# Patient Record
Sex: Female | Born: 1985
Health system: Southern US, Community
[De-identification: ages and names within clinical notes are randomized; demographics above are authoritative.]

## PROBLEM LIST (undated history)

## (undated) DIAGNOSIS — O321XX0 Maternal care for breech presentation, not applicable or unspecified: Secondary | ICD-10-CM

## (undated) DIAGNOSIS — T7840XA Allergy, unspecified, initial encounter: Secondary | ICD-10-CM

## (undated) DIAGNOSIS — N1330 Unspecified hydronephrosis: Secondary | ICD-10-CM

## (undated) HISTORY — DX: Unspecified hydronephrosis: N13.30

## (undated) HISTORY — DX: Allergy, unspecified, initial encounter: T78.40XA

## (undated) HISTORY — PX: ABDOMINAL SURGERY: SHX537

## (undated) HISTORY — DX: Maternal care for breech presentation, not applicable or unspecified: O32.1XX0

---

## 2015-02-18 HISTORY — PX: ANTERIOR CRUCIATE LIGAMENT REPAIR: SHX115

## 2016-09-13 LAB — OB RESULTS CONSOLE HIV ANTIBODY (ROUTINE TESTING): HIV: NONREACTIVE

## 2016-09-13 LAB — OB RESULTS CONSOLE ABO/RH: RH Type: POSITIVE

## 2016-09-13 LAB — OB RESULTS CONSOLE ANTIBODY SCREEN: ANTIBODY SCREEN: NEGATIVE

## 2016-09-13 LAB — OB RESULTS CONSOLE HEPATITIS B SURFACE ANTIGEN: HEP B S AG: NEGATIVE

## 2016-09-13 LAB — OB RESULTS CONSOLE GC/CHLAMYDIA
CHLAMYDIA, DNA PROBE: NEGATIVE
Gonorrhea: NEGATIVE

## 2016-09-13 LAB — OB RESULTS CONSOLE RUBELLA ANTIBODY, IGM: Rubella: IMMUNE

## 2016-09-13 LAB — OB RESULTS CONSOLE RPR: RPR: NONREACTIVE

## 2016-10-25 ENCOUNTER — Telehealth (HOSPITAL_COMMUNITY): Payer: Self-pay | Admitting: *Deleted

## 2016-10-25 ENCOUNTER — Encounter (HOSPITAL_COMMUNITY): Payer: Self-pay | Admitting: *Deleted

## 2016-10-25 NOTE — Telephone Encounter (Signed)
Preadmission screen  

## 2016-10-25 NOTE — Telephone Encounter (Signed)
Anesthesia notified of pt interest in epidural for version.

## 2016-10-31 ENCOUNTER — Observation Stay (HOSPITAL_COMMUNITY)
Admission: RE | Admit: 2016-10-31 | Discharge: 2016-10-31 | Disposition: A | Discharge status: Home / Self Care | Payer: BLUE CROSS/BLUE SHIELD | Source: Ambulatory Visit | Attending: Obstetrics and Gynecology | Admitting: Obstetrics and Gynecology

## 2016-10-31 ENCOUNTER — Encounter (HOSPITAL_COMMUNITY): Payer: Self-pay

## 2016-10-31 DIAGNOSIS — Z3A37 37 weeks gestation of pregnancy: Secondary | ICD-10-CM | POA: Insufficient documentation

## 2016-10-31 DIAGNOSIS — O321XX0 Maternal care for breech presentation, not applicable or unspecified: Secondary | ICD-10-CM | POA: Diagnosis not present

## 2016-10-31 MED ORDER — LACTATED RINGERS IV SOLN
INTRAVENOUS | Status: DC
  Administered 2016-10-31: 08:00:00 via INTRAVENOUS

## 2016-10-31 MED ORDER — TERBUTALINE SULFATE 1 MG/ML IJ SOLN
0.2500 mg | Freq: Once | INTRAMUSCULAR | Status: AC
  Administered 2016-10-31: 0.25 mg via SUBCUTANEOUS
  Filled 2016-10-31: qty 1

## 2016-10-31 NOTE — Procedures (Signed)
External Cephalic Version  Pt was confirmed breech by ultrasound today.  She desires attempt at ECV.  Risks and benefits have been discussed at length and informed consent was obtained. EFM with reactive tracing and patient was given terbutaline .25mg  SQ.  IV had been placed and Ultrasound at bedside.  Fetal vertex in LUQ was confirmed and fetal buttocks was elevated out of the pelvis.  In a clockwise fashion, ECV was attempt 3 times without successful placement of fetal vertex in the cephalic position.  Continued frank breech presentation was confirmed by ultrasound.  Patient agreed that no further attempts should be tried.  Pt and fetus tolerated the procedure well.  EFM was reassuring immediately after the procedure and the patient was excited and reassured by the procedure. Plan to monitor EFM for 1 hour and if remains reassuring will discharge the patient home with routine labor warnings and kickcounts.  Follow up in the office as scheduled next week.

## 2016-10-31 NOTE — H&P (Signed)
Debra Debra [name] is a 31 y.o. female presenting for ECV attempt. Pregnancy uncomplicated.  US yesterday Debra [name] breech with AFI 10 and EFW 50%.  GBS-. OB History    Gravida Para Term Preterm AB Living   2       1     SAB TAB Ectopic Multiple Live Births   1             Past Medical History:  Diagnosis Date  . Hydronephrosis    Past Surgical History:  Procedure Laterality Date  . ABDOMINAL SURGERY     age 89  . ANTERIOR CRUCIATE LIGAMENT REPAIR     Family History: family history includes Alcohol abuse in her father and paternal grandfather; Breast cancer in her maternal grandmother and mother; Rheum arthritis in her paternal aunt. Social History:  reports that she has never smoked. She has never used smokeless tobacco. She reports that she does not drink alcohol or use drugs.     Maternal Diabetes: No Genetic Screening: Normal Maternal Ultrasounds/Referrals: Normal Fetal Ultrasounds or other Referrals:  None Maternal Substance Abuse:  No Significant Maternal Medications:  None Significant Maternal Lab Results:  None Other Comments:  None  ROS History   There were no vitals taken for this visit. Exam Physical Exam  Breech Cx 2/50/high Prenatal labs: ABO, Rh: O/Positive/-- (06/27 0000) Antibody: Negative (06/27 0000) Rubella: Immune (06/27 0000) RPR: Nonreactive (06/27 0000)  HBsAg: Negative (06/27 0000)  HIV: Non-reactive (06/27 0000)  GBS:     Assessment/Plan: IUP at term Debra Debra [name] Breech presentation Pt desires ECV attempt.  R&B discussed at length and informed consent obtained.    Debra Debra [name] C 10/31/2016, 8:19 AM

## 2016-10-31 NOTE — Discharge Summary (Signed)
Physician Discharge Summary  Patient ID: Debra [name] MRN: [id number] DOB/AGE: 33-Dec-1987 33 y.o.  Admit date: 10/31/2016 Discharge date: 10/31/2016  Admission Diagnoses:breech presentation for attempt at ECV  Discharge Diagnoses: breech presentation Active Problems:   * No active hospital problems. *   Discharged Condition: good  Hospital Course: [facility] After bedside US and EFM was discharged home  Consults: None  Significant Diagnostic Studies: bedside ultrasound  Treatments: external cephalic version attempt  Discharge Exam: There were no vitals taken for this visit. General appearance: alert, cooperative, appears stated age and no distress gravid,nt  breech  Disposition: Final discharge disposition not confirmed  Discharge Instructions    Diet general    Complete by:  As directed      Allergies as of 10/31/2016   No Known Allergies     Medication List    You have not been prescribed any medications.      Signed: Adaia Matthies C 10/31/2016, 8:50 AM

## 2016-11-03 ENCOUNTER — Encounter (HOSPITAL_COMMUNITY): Payer: Self-pay

## 2016-11-09 NOTE — H&P (Addendum)
Debra [name] is a 31 y.o. female @ 39w presenting for primary c-section - breech presentation.  Pt had attempted ECV - fetus still breech.  Pregnancy uncomplicated.  OB History    Gravida Para Term Preterm AB Living   2       1     SAB TAB Ectopic Multiple Live Births   1             Past Medical History:  Diagnosis Date  . Hydronephrosis    Past Surgical History:  Procedure Laterality Date  . ABDOMINAL SURGERY     age 72  . ANTERIOR CRUCIATE LIGAMENT REPAIR     Family History: family history includes Alcohol abuse in her father and paternal grandfather; Breast cancer in her maternal grandmother and mother; Rheum arthritis in her paternal aunt. Social History:  reports that she has never smoked. She has never used smokeless tobacco. She reports that she does not drink alcohol or use drugs.     Maternal Diabetes: No Genetic Screening: Declined Maternal Ultrasounds/Referrals: Normal Fetal Ultrasounds or other Referrals:  None Maternal Substance Abuse:  No Significant Maternal Medications:  None Significant Maternal Lab Results:  None Other Comments:  None  ROS History   Exam Physical Exam  Gen - NAD ABd - gravid, NT  EFW 7.5# Ext - NT Cvx 1-2cm Prenatal labs: ABO, Rh: O/Positive/-- (06/27 0000) Antibody: Negative (06/27 0000) Rubella: Immune (06/27 0000) RPR: Nonreactive (06/27 0000)  HBsAg: Negative (06/27 0000)  HIV: Non-reactive (06/27 0000)  GBS:   neg  Assessment/Plan: Breech - for primary c-section R/b/a discussed, questions answered, informed consent  Marios Gaiser 11/09/2016, 2:44 PM

## 2016-11-10 ENCOUNTER — Telehealth (HOSPITAL_COMMUNITY): Payer: Self-pay | Admitting: *Deleted

## 2016-11-10 ENCOUNTER — Encounter (HOSPITAL_COMMUNITY)
Admission: RE | Admit: 2016-11-10 | Discharge: 2016-11-10 | Disposition: A | Discharge status: Home / Self Care | Payer: BLUE CROSS/BLUE SHIELD | Source: Ambulatory Visit | Attending: Obstetrics and Gynecology | Admitting: Obstetrics and Gynecology

## 2016-11-10 DIAGNOSIS — N1330 Unspecified hydronephrosis: Secondary | ICD-10-CM | POA: Insufficient documentation

## 2016-11-10 DIAGNOSIS — Z01812 Encounter for preprocedural laboratory examination: Secondary | ICD-10-CM

## 2016-11-10 LAB — TYPE AND SCREEN
ABO/RH(D): O POS
ANTIBODY SCREEN: NEGATIVE

## 2016-11-10 LAB — CBC
HCT: 35.6 % — ABNORMAL LOW (ref 36.0–46.0)
Hemoglobin: 11.6 g/dL — ABNORMAL LOW (ref 12.0–15.0)
MCH: 26.8 pg (ref 26.0–34.0)
MCHC: 32.6 g/dL (ref 30.0–36.0)
MCV: 82.2 fL (ref 78.0–100.0)
PLATELETS: 232 10*3/uL (ref 150–400)
RBC: 4.33 MIL/uL (ref 3.87–5.11)
RDW: 14 % (ref 11.5–15.5)
WBC: 9.2 10*3/uL (ref 4.0–10.5)

## 2016-11-10 LAB — ABO/RH: ABO/RH(D): O POS

## 2016-11-10 NOTE — Patient Instructions (Signed)
20 Afrika KENNADI RECLA  11/10/2016   Your procedure is scheduled on:  11/14/2016  Enter through the Main Entrance of [facility] at 1100 AM.  Pick up the phone at the desk and dial [phone].   Call this number if you have problems the morning of surgery: [phone]   Remember:   Do not eat food:After Midnight.  Do not drink clear liquids: After Midnight.  Take these medicines the morning of surgery with A SIP OF WATER: NONE   Do not wear jewelry, make-up or nail polish.  Do not wear lotions, powders, or perfumes. Do not wear deodorant.  Do not shave 48 hours prior to surgery.  Do not bring valuables to the hospital.  [facility] is not   responsible for any belongings or valuables brought to the hospital.  Contacts, dentures or bridgework may not be worn into surgery.  Leave suitcase in the car. After surgery it may be brought to your room.  For patients admitted to the hospital, checkout time is 11:00 AM the day of              discharge.   Patients discharged the day of surgery will not be allowed to drive             home.  Name and phone number of your driver: NA  Special Instructions:   N/A   Please read over the following fact sheets that you were given:   Surgical Site Infection Prevention

## 2016-11-11 LAB — RPR: RPR: NONREACTIVE

## 2016-11-12 NOTE — Anesthesia Preprocedure Evaluation (Addendum)
Anesthesia Evaluation  Patient identified by MRN, date of birth, ID band Patient awake    Reviewed: Allergy & Precautions, H&P , NPO status , Patient's Chart, lab work & pertinent test results  Airway Mallampati: I  TM Distance: >3 FB Neck ROM: full    Dental no notable dental hx.    Pulmonary neg pulmonary ROS,    Pulmonary exam normal breath sounds clear to auscultation       Cardiovascular negative cardio ROS Normal cardiovascular exam Rhythm:regular Rate:Normal     Neuro/Psych negative neurological ROS  negative psych ROS   GI/Hepatic negative GI ROS, Neg liver ROS,   Endo/Other  negative endocrine ROS  Renal/GU negative Renal ROS  negative genitourinary   Musculoskeletal   Abdominal   Peds  Hematology negative hematology ROS (+)   Anesthesia Other Findings   Reproductive/Obstetrics (+) Pregnancy                            Anesthesia Physical Anesthesia Plan  ASA: II  Anesthesia Plan: Spinal   Post-op Pain Management:    Induction:   PONV Risk Score and Plan: 2 and Ondansetron, Dexamethasone and Scopolamine patch - Pre-op  Airway Management Planned: Nasal Cannula  Additional Equipment:   Intra-op Plan:   Post-operative Plan:   Informed Consent: I have reviewed the patients History and Physical, chart, labs and discussed the procedure including the risks, benefits and alternatives for the proposed anesthesia with the patient or authorized representative who has indicated his/her understanding and acceptance.     Plan Discussed with:   Anesthesia Plan Comments:         Anesthesia Quick Evaluation

## 2016-11-13 ENCOUNTER — Encounter (HOSPITAL_COMMUNITY): Payer: Self-pay | Admitting: General Practice

## 2016-11-13 ENCOUNTER — Inpatient Hospital Stay (HOSPITAL_COMMUNITY)
Admission: AD | Admit: 2016-11-13 | Discharge: 2016-11-15 | DRG: 766 | Disposition: A | Discharge status: Home / Self Care | Payer: BLUE CROSS/BLUE SHIELD | Source: Ambulatory Visit | Attending: Obstetrics and Gynecology | Admitting: Obstetrics and Gynecology

## 2016-11-13 ENCOUNTER — Inpatient Hospital Stay (HOSPITAL_COMMUNITY): Payer: BLUE CROSS/BLUE SHIELD | Admitting: Anesthesiology

## 2016-11-13 ENCOUNTER — Encounter (HOSPITAL_COMMUNITY)
Admission: AD | Disposition: A | Discharge status: Home / Self Care | Payer: Self-pay | Source: Ambulatory Visit | Attending: Obstetrics and Gynecology

## 2016-11-13 DIAGNOSIS — Z3A39 39 weeks gestation of pregnancy: Secondary | ICD-10-CM

## 2016-11-13 DIAGNOSIS — O321XX0 Maternal care for breech presentation, not applicable or unspecified: Principal | ICD-10-CM | POA: Diagnosis present

## 2016-11-13 HISTORY — DX: Maternal care for breech presentation, not applicable or unspecified: O32.1XX0

## 2016-11-13 SURGERY — Surgical Case
Anesthesia: Spinal

## 2016-11-13 MED ORDER — ACETAMINOPHEN 325 MG PO TABS
650.0000 mg | ORAL_TABLET | ORAL | Status: DC | PRN
  Administered 2016-11-13: 650 mg via ORAL
  Filled 2016-11-13: qty 2

## 2016-11-13 MED ORDER — MENTHOL 3 MG MT LOZG: 1.0000 | LOZENGE | OROMUCOSAL | Status: DC | PRN

## 2016-11-13 MED ORDER — DIPHENHYDRAMINE HCL 25 MG PO CAPS: 25.0000 mg | ORAL_CAPSULE | Freq: Four times a day (QID) | ORAL | Status: DC | PRN

## 2016-11-13 MED ORDER — ONDANSETRON HCL 4 MG PO TABS
8.0000 mg | ORAL_TABLET | Freq: Three times a day (TID) | ORAL | Status: DC | PRN
  Filled 2016-11-13: qty 2

## 2016-11-13 MED ORDER — OXYTOCIN 10 UNIT/ML IJ SOLN
INTRAVENOUS | Status: DC | PRN
  Administered 2016-11-13: 40 [IU] via INTRAVENOUS

## 2016-11-13 MED ORDER — SODIUM CHLORIDE 0.9 % IR SOLN
Status: DC | PRN
  Administered 2016-11-13: 1000 mL

## 2016-11-13 MED ORDER — MEDROXYPROGESTERONE ACETATE 150 MG/ML IM SUSP: 150.0000 mg | INTRAMUSCULAR | Status: DC | PRN

## 2016-11-13 MED ORDER — FENTANYL CITRATE (PF) 100 MCG/2ML IJ SOLN
INTRAMUSCULAR | Status: AC
  Filled 2016-11-13: qty 2

## 2016-11-13 MED ORDER — SIMETHICONE 80 MG PO CHEW: 80.0000 mg | CHEWABLE_TABLET | ORAL | Status: DC | PRN

## 2016-11-13 MED ORDER — MEASLES, MUMPS & RUBELLA VAC ~~LOC~~ INJ
0.5000 mL | INJECTION | Freq: Once | SUBCUTANEOUS | Status: DC
  Filled 2016-11-13: qty 0.5

## 2016-11-13 MED ORDER — OXYTOCIN 40 UNITS IN LACTATED RINGERS INFUSION - SIMPLE MED: 2.5000 [IU]/h | INTRAVENOUS | Status: AC

## 2016-11-13 MED ORDER — WITCH HAZEL-GLYCERIN EX PADS: 1.0000 "application " | MEDICATED_PAD | CUTANEOUS | Status: DC | PRN

## 2016-11-13 MED ORDER — SENNOSIDES-DOCUSATE SODIUM 8.6-50 MG PO TABS
2.0000 | ORAL_TABLET | ORAL | Status: DC
  Administered 2016-11-14 (×2): 2 via ORAL
  Filled 2016-11-13 (×2): qty 2

## 2016-11-13 MED ORDER — FENTANYL CITRATE (PF) 100 MCG/2ML IJ SOLN
INTRAMUSCULAR | Status: DC | PRN
  Administered 2016-11-13: 25 ug via INTRATHECAL

## 2016-11-13 MED ORDER — LACTATED RINGERS IV SOLN
INTRAVENOUS | Status: DC | PRN
  Administered 2016-11-13: 13:00:00 via INTRAVENOUS

## 2016-11-13 MED ORDER — MORPHINE SULFATE (PF) 0.5 MG/ML IJ SOLN
INTRAMUSCULAR | Status: DC | PRN
  Administered 2016-11-13: .2 mg via INTRATHECAL

## 2016-11-13 MED ORDER — IBUPROFEN 600 MG PO TABS
600.0000 mg | ORAL_TABLET | Freq: Four times a day (QID) | ORAL | Status: DC
  Administered 2016-11-14 – 2016-11-15 (×6): 600 mg via ORAL
  Filled 2016-11-13 (×8): qty 1

## 2016-11-13 MED ORDER — OXYTOCIN 10 UNIT/ML IJ SOLN
INTRAMUSCULAR | Status: AC
  Filled 2016-11-13: qty 4

## 2016-11-13 MED ORDER — DEXAMETHASONE SODIUM PHOSPHATE 4 MG/ML IJ SOLN
INTRAMUSCULAR | Status: AC
Start: 2016-11-13 — End: 2016-11-13
  Filled 2016-11-13: qty 1

## 2016-11-13 MED ORDER — DEXAMETHASONE SODIUM PHOSPHATE 4 MG/ML IJ SOLN
INTRAMUSCULAR | Status: DC | PRN
  Administered 2016-11-13: 4 mg via INTRAVENOUS

## 2016-11-13 MED ORDER — BUPIVACAINE IN DEXTROSE 0.75-8.25 % IT SOLN
INTRATHECAL | Status: AC
Start: 2016-11-13 — End: 2016-11-13
  Filled 2016-11-13: qty 2

## 2016-11-13 MED ORDER — DEXTROSE IN LACTATED RINGERS 5 % IV SOLN: INTRAVENOUS | Status: DC

## 2016-11-13 MED ORDER — COCONUT OIL OIL
1.0000 "application " | TOPICAL_OIL | Status: DC | PRN
  Filled 2016-11-13: qty 120

## 2016-11-13 MED ORDER — SIMETHICONE 80 MG PO CHEW
80.0000 mg | CHEWABLE_TABLET | Freq: Three times a day (TID) | ORAL | Status: DC
  Administered 2016-11-14 – 2016-11-15 (×4): 80 mg via ORAL
  Filled 2016-11-13 (×4): qty 1

## 2016-11-13 MED ORDER — ONDANSETRON HCL 4 MG/2ML IJ SOLN: 4.0000 mg | Freq: Once | INTRAMUSCULAR | Status: DC | PRN

## 2016-11-13 MED ORDER — LACTATED RINGERS IV SOLN
INTRAVENOUS | Status: DC
  Administered 2016-11-13 (×2): via INTRAVENOUS

## 2016-11-13 MED ORDER — SOD CITRATE-CITRIC ACID 500-334 MG/5ML PO SOLN
ORAL | Status: AC
  Administered 2016-11-13: 30 mL via ORAL
  Filled 2016-11-13: qty 15

## 2016-11-13 MED ORDER — TETANUS-DIPHTH-ACELL PERTUSSIS 5-2.5-18.5 LF-MCG/0.5 IM SUSP
0.5000 mL | Freq: Once | INTRAMUSCULAR | Status: AC
  Administered 2016-11-15: 0.5 mL via INTRAMUSCULAR

## 2016-11-13 MED ORDER — SOD CITRATE-CITRIC ACID 500-334 MG/5ML PO SOLN
30.0000 mL | Freq: Once | ORAL | Status: AC
  Administered 2016-11-13: 30 mL via ORAL

## 2016-11-13 MED ORDER — PHENYLEPHRINE 8 MG IN D5W 100 ML (0.08MG/ML) PREMIX OPTIME
INJECTION | INTRAVENOUS | Status: AC
  Filled 2016-11-13: qty 100

## 2016-11-13 MED ORDER — MORPHINE SULFATE (PF) 0.5 MG/ML IJ SOLN
INTRAMUSCULAR | Status: AC
  Filled 2016-11-13: qty 10

## 2016-11-13 MED ORDER — PHENYLEPHRINE 8 MG IN D5W 100 ML (0.08MG/ML) PREMIX OPTIME
INJECTION | INTRAVENOUS | Status: DC | PRN
  Administered 2016-11-13: 60 ug/min via INTRAVENOUS

## 2016-11-13 MED ORDER — ONDANSETRON HCL 4 MG/2ML IJ SOLN
INTRAMUSCULAR | Status: AC
  Filled 2016-11-13: qty 2

## 2016-11-13 MED ORDER — SIMETHICONE 80 MG PO CHEW
80.0000 mg | CHEWABLE_TABLET | ORAL | Status: DC
  Administered 2016-11-14 (×2): 80 mg via ORAL
  Filled 2016-11-13 (×2): qty 1

## 2016-11-13 MED ORDER — ONDANSETRON HCL 4 MG/2ML IJ SOLN
INTRAMUSCULAR | Status: DC | PRN
  Administered 2016-11-13: 4 mg via INTRAVENOUS

## 2016-11-13 MED ORDER — FENTANYL CITRATE (PF) 100 MCG/2ML IJ SOLN: 25.0000 ug | INTRAMUSCULAR | Status: DC | PRN

## 2016-11-13 MED ORDER — ONDANSETRON 8 MG PO TBDP
8.0000 mg | ORAL_TABLET | Freq: Three times a day (TID) | ORAL | Status: DC | PRN
  Filled 2016-11-13: qty 1

## 2016-11-13 MED ORDER — OXYCODONE-ACETAMINOPHEN 5-325 MG PO TABS: 2.0000 | ORAL_TABLET | ORAL | Status: DC | PRN

## 2016-11-13 MED ORDER — OXYCODONE-ACETAMINOPHEN 5-325 MG PO TABS: 1.0000 | ORAL_TABLET | ORAL | Status: DC | PRN

## 2016-11-13 MED ORDER — PROMETHAZINE HCL 25 MG/ML IJ SOLN: 12.5000 mg | Freq: Four times a day (QID) | INTRAMUSCULAR | Status: DC | PRN

## 2016-11-13 MED ORDER — MEPERIDINE HCL 25 MG/ML IJ SOLN: 6.2500 mg | INTRAMUSCULAR | Status: DC | PRN

## 2016-11-13 MED ORDER — CEFAZOLIN SODIUM-DEXTROSE 2-4 GM/100ML-% IV SOLN
2.0000 g | INTRAVENOUS | Status: AC
  Administered 2016-11-13: 2 g via INTRAVENOUS

## 2016-11-13 MED ORDER — PRENATAL MULTIVITAMIN CH
1.0000 | ORAL_TABLET | Freq: Every day | ORAL | Status: DC
  Administered 2016-11-14 – 2016-11-15 (×2): 1 via ORAL
  Filled 2016-11-13 (×2): qty 1

## 2016-11-13 MED ORDER — DIBUCAINE 1 % RE OINT: 1.0000 "application " | TOPICAL_OINTMENT | RECTAL | Status: DC | PRN

## 2016-11-13 SURGICAL SUPPLY — 30 items
CHLORAPREP W/TINT 26ML (MISCELLANEOUS) ×2 IMPLANT
CLAMP CORD UMBIL (MISCELLANEOUS) IMPLANT
CLOTH BEACON ORANGE TIMEOUT ST (SAFETY) ×2 IMPLANT
DERMABOND ADHESIVE PROPEN (GAUZE/BANDAGES/DRESSINGS) ×1
DERMABOND ADVANCED (GAUZE/BANDAGES/DRESSINGS) ×1
DERMABOND ADVANCED .7 DNX12 (GAUZE/BANDAGES/DRESSINGS) ×1 IMPLANT
DERMABOND ADVANCED .7 DNX6 (GAUZE/BANDAGES/DRESSINGS) ×1 IMPLANT
DRSG OPSITE POSTOP 4X10 (GAUZE/BANDAGES/DRESSINGS) ×2 IMPLANT
ELECT REM PT RETURN 9FT ADLT (ELECTROSURGICAL) ×2
ELECTRODE REM PT RTRN 9FT ADLT (ELECTROSURGICAL) ×1 IMPLANT
EXTRACTOR VACUUM M CUP 4 TUBE (SUCTIONS) IMPLANT
GLOVE BIO SURGEON STRL SZ 6.5 (GLOVE) ×2 IMPLANT
GLOVE BIOGEL PI IND STRL 7.0 (GLOVE) ×2 IMPLANT
GLOVE BIOGEL PI INDICATOR 7.0 (GLOVE) ×2
GOWN STRL REUS W/TWL LRG LVL3 (GOWN DISPOSABLE) ×4 IMPLANT
KIT ABG SYR 3ML LUER SLIP (SYRINGE) IMPLANT
NEEDLE HYPO 25X5/8 SAFETYGLIDE (NEEDLE) IMPLANT
NS IRRIG 1000ML POUR BTL (IV SOLUTION) ×2 IMPLANT
PACK C SECTION WH (CUSTOM PROCEDURE TRAY) ×2 IMPLANT
PAD OB MATERNITY 4.3X12.25 (PERSONAL CARE ITEMS) ×2 IMPLANT
PENCIL SMOKE EVAC W/HOLSTER (ELECTROSURGICAL) ×2 IMPLANT
SUT CHROMIC 0 CT 802H (SUTURE) IMPLANT
SUT CHROMIC 0 CTX 36 (SUTURE) ×6 IMPLANT
SUT MON AB-0 CT1 36 (SUTURE) ×2 IMPLANT
SUT PDS AB 0 CTX 60 (SUTURE) ×2 IMPLANT
SUT PLAIN 0 NONE (SUTURE) IMPLANT
SUT VIC AB 4-0 KS 27 (SUTURE) IMPLANT
SYR BULB 3OZ (MISCELLANEOUS) ×2 IMPLANT
TOWEL OR 17X24 6PK STRL BLUE (TOWEL DISPOSABLE) ×2 IMPLANT
TRAY FOLEY BAG SILVER LF 14FR (SET/KITS/TRAYS/PACK) IMPLANT

## 2016-11-13 NOTE — Addendum Note (Signed)
Addendum  created 11/13/16 1748 by Graciela Husbands, CRNA   Sign clinical note

## 2016-11-13 NOTE — Anesthesia Postprocedure Evaluation (Signed)
Anesthesia Post Note  Patient: Debra [name]  Procedure(s) Performed: Procedure(s) (LRB): CESAREAN SECTION (N/A)     Patient location during evaluation: Mother Baby Anesthesia Type: Spinal Level of consciousness: awake and alert and oriented Pain management: satisfactory to patient Vital Signs Assessment: post-procedure vital signs reviewed and stable Respiratory status: respiratory function stable and spontaneous breathing Cardiovascular status: blood pressure returned to baseline Postop Assessment: no headache, no backache, spinal receding, patient able to bend at knees and adequate PO intake Anesthetic complications: no    Last Vitals:  Vitals:   11/13/16 1624 11/13/16 1743  BP: 131/81   Pulse: 97   Resp: 20 20  Temp:    SpO2: 100%     Last Pain:  Vitals:   11/13/16 1624  TempSrc:   PainSc: 2    Pain Goal:                 [name]

## 2016-11-13 NOTE — Lactation Note (Addendum)
This note was copied from a baby's chart. Lactation Consultation Note  Patient Name: Girl Kassity Croushore Today's Date: 11/13/2016 Reason for consult: Initial assessment   P1, 8 hours old. Mother's nipples are inverted.   Reviewed hand expression with teachback.  Drops expressed.  Baby latched with #20NS for approx 20 min.  During feeding time baby did latch for approx 5 min without NS halfway through feeding. Provided mother w/ #24NS in case #20NS becomes uncomfortable. Encouraged family to try latching without nipple shield half way through feeding at least 1-2 times per day. Demonstrated how to wear shells and prepump w/ manual pump. Reviewed basics.  Recommend family spoon feed with each feeding. Discussed if continuing to use NS, will start mother pumping with DEBP tomorrow. Mom encouraged to feed baby 8-12 times/24 hours and with feeding cues.  Mom made aware of O/P services, breastfeeding support groups, community resources, and our phone # for post-discharge questions.      Maternal Data Has patient been taught Hand Expression?: Yes Does the patient have breastfeeding experience prior to this delivery?: No  Feeding Feeding Type: Breast Fed Length of feed: 20 min  LATCH Score Latch: Grasps breast easily, tongue down, lips flanged, rhythmical sucking.  Audible Swallowing: A few with stimulation  Type of Nipple: Inverted  Comfort (Breast/Nipple): Soft / non-tender  Hold (Positioning): Assistance needed to correctly position infant at breast and maintain latch.  LATCH Score: 6  Interventions Interventions: Breast feeding basics reviewed;Assisted with latch;Skin to skin;Breast massage;Pre-pump if needed;Hand express;Reverse pressure;Breast compression;Adjust position;Support pillows;Position options;Expressed milk;Shells;Hand pump  Lactation Tools Discussed/Used Tools: Nipple Dorris Carnes;Shells;Pump Nipple shield size: 20 Shell Type: Inverted Breast pump type:  Manual   Consult Status Consult Status: Follow-up Date: 11/14/16 Follow-up type: In-patient    Dahlia Byes [facility] 11/13/2016, 9:19 PM

## 2016-11-13 NOTE — Transfer of Care (Signed)
Immediate Anesthesia Transfer of Care Note  Patient: Debra [name]  Procedure(s) Performed: Procedure(s) with comments: CESAREAN SECTION (N/A) - Primary edc 11/17/16 NKDA need RNFA  Patient Location: PACU  Anesthesia Type:Spinal  Level of Consciousness: awake, alert  and oriented  Airway & Oxygen Therapy: Patient Spontanous Breathing  Post-op Assessment: Report given to RN and Post -op Vital signs reviewed and stable  Post vital signs: Reviewed and stable  Last Vitals:  Vitals:   11/13/16 1112  BP: 130/80  Pulse: 90  Resp: 16  Temp: 37 C    Last Pain:  Vitals:   11/13/16 1112  TempSrc: Oral         Complications: No apparent anesthesia complications

## 2016-11-13 NOTE — Op Note (Signed)
Cesarean Section Procedure Note   Debra [name]  11/13/2016  Indications: Breech Presentation   Pre-operative Diagnosis: breech.   Post-operative Diagnosis: Same   Surgeon: Surgeon(s) and Role:    Zelphia Cairo, MD - Primary   Assistants: Odelia Gage, RNFA  Anesthesia: spinal   Procedure Details:  The patient was seen in the Holding Room. The risks, benefits, complications, treatment options, and expected outcomes were discussed with the patient. The patient concurred with the proposed plan, giving informed consent. identified as Debra [name] and the procedure verified as C-Section Delivery. A Time Out was held and the above information confirmed.  After induction of anesthesia, the patient was draped and prepped in the usual sterile manner. A transverse was made and carried down through the subcutaneous tissue to the fascia. Fascial incision was made and extended transversely. The fascia was separated from the underlying rectus tissue superiorly and inferiorly. The peritoneum was identified and entered. Peritoneal incision was extended longitudinally. The utero-vesical peritoneal reflection was incised transversely and the bladder flap was bluntly freed from the lower uterine segment. A low transverse uterine incision was made. Delivered from Breech presentation was a viable female infant. After 1 minute, the cord was clamped and cut and the infant was taken to the pediatric team.  Cord blood was collected.  Cord ph was not sent the umbilical cord was clamped and cut cord blood was obtained for evaluation. The placenta was removed Intact and appeared normal. The uterine outline, tubes and ovaries appeared normal}. The uterine incision was closed with running locked sutures of 0chromic gut.   Hemostasis was observed. Lavage was carried out until clear. The fascia was then reapproximated with running sutures of 0PDS. The skin was closed with 4-0Vicryl.   Instrument, sponge, and  needle counts were correct prior the abdominal closure and were correct at the conclusion of the case.    Findings:   Estimated Blood Loss: approx 600cc   Urine Output: clear  Specimens: placenta for disposal   Complications: no complications  Disposition: PACU - hemodynamically stable.   Maternal Condition: stable   Baby condition / location:  Couplet care / Skin to Skin  Attending Attestation: I was present and scrubbed for the entire procedure.   Signed: Surgeon(s): Zelphia Cairo, MD

## 2016-11-13 NOTE — Anesthesia Postprocedure Evaluation (Signed)
Anesthesia Post Note  Patient: Debra [name]  Procedure(s) Performed: Procedure(s) (LRB): CESAREAN SECTION (N/A)     Patient location during evaluation: PACU Anesthesia Type: Spinal Level of consciousness: oriented and awake and alert Pain management: pain level controlled Vital Signs Assessment: post-procedure vital signs reviewed and stable Respiratory status: spontaneous breathing, respiratory function stable and patient connected to nasal cannula oxygen Cardiovascular status: blood pressure returned to baseline and stable Postop Assessment: no headache and no backache Anesthetic complications: no    Last Vitals:  Vitals:   11/13/16 1431 11/13/16 1433  BP: 126/90   Pulse: (!) 108 99  Resp: 17 16  Temp:    SpO2: 99% 99%    Last Pain:  Vitals:   11/13/16 1400  TempSrc: Oral  PainSc: 0-No pain   Pain Goal:                 Trey Bebee

## 2016-11-14 LAB — CBC
HEMATOCRIT: 30.7 % — AB (ref 36.0–46.0)
Hemoglobin: 10.5 g/dL — ABNORMAL LOW (ref 12.0–15.0)
MCH: 27.7 pg (ref 26.0–34.0)
MCHC: 34.2 g/dL (ref 30.0–36.0)
MCV: 81 fL (ref 78.0–100.0)
Platelets: 211 10*3/uL (ref 150–400)
RBC: 3.79 MIL/uL — ABNORMAL LOW (ref 3.87–5.11)
RDW: 14.2 % (ref 11.5–15.5)
WBC: 13.4 10*3/uL — ABNORMAL HIGH (ref 4.0–10.5)

## 2016-11-14 LAB — BIRTH TISSUE RECOVERY COLLECTION (PLACENTA DONATION)

## 2016-11-14 NOTE — Lactation Note (Signed)
This note was copied from a baby's chart. Lactation Consultation Note  P1 mom with partner holding infant in blankets.  LC offered to help with feed; parents say that it's been 3 hours since last feed.  Mom using 24 NS, says she knows how to hand express but when attempted prior to latch nothing seen on nipple.  Mom used NS and attempted cradle hold latch.  LC provided pillows and sat up bed more and instructed mom on cross cradle hold.  Illustrations from book used.  Mom was taught to massage and use compression with feeds.  Mom taught to hold infant closer where ns is not visible.  Dad at bedside very supportive.  After several minutes, infant began good suck swallow pattern with swallows heard.  Mom very pleased to hear these.  LC instructed mom to listen for swallows with each feed and to use massage and compression in order to elicit swallows.  After infant fell asleep, dad burped infant and mom latched infant in football hold on opposite breast.  Infant latched easier and good rhythmic jaw movement noted throughout feed. Infant fed a total of 30 minutes then fell asleep at breast.  When mom removed shield, nipple had been pulled out and colostrum was coated on shield.   LC encouraged mom to hand express prior to and after feeds.  LC set up DEBP for mom and encouraged her to pump 4-6 times a day in order to ensure proper stimulation of milk supply due to NS use.  Set up, cleaning, and storage reviewed with mom.  Mom declined to pump after 30 minute feed but states she understands pump use and will call out for assistance with first pump. Family encouraged to call out for questions or assistance with feeds.  Patient Name: Debra [name] XHFSF'S Date: 11/14/2016 Reason for consult: Follow-up assessment   Maternal Data    Feeding Feeding Type: Breast Fed Length of feed: 30 min  LATCH Score Latch: Grasps breast easily, tongue down, lips flanged, rhythmical sucking.  Audible Swallowing: A  few with stimulation  Type of Nipple: Inverted  Comfort (Breast/Nipple): Soft / non-tender  Hold (Positioning): Assistance needed to correctly position infant at breast and maintain latch.  LATCH Score: 6  Interventions Interventions: Breast feeding basics reviewed;Assisted with latch;Skin to skin;Breast massage;DEBP;Shells;Position options;Support pillows  Lactation Tools Discussed/Used Tools: Pump;Shells;Nipple Shields Nipple shield size: 24 Shell Type: Inverted Breast pump type: Double-Electric Breast Pump WIC Program: No Pump Review: Setup, frequency, and cleaning;Milk Storage Initiated by:: Threasa Alpha Date initiated:: 11/14/16   Consult Status Consult Status: Follow-up Date: 11/15/16 Follow-up type: In-patient    Maryruth Hancock [facility] 11/14/2016, 3:44 PM

## 2016-11-14 NOTE — Progress Notes (Signed)
Subjective: Postpartum Day 1: Cesarean Delivery Patient reports tolerating PO.    Objective: Vital signs in last 24 hours: Temp:  [98 F (36.7 C)-98.9 F (37.2 C)] 98.5 F (36.9 C) (08/28 9242) Pulse Rate:  [87-108] 101 (08/28 0632) Resp:  [15-29] 18 (08/28 6834) BP: (119-134)/(76-90) 129/85 (08/28 1962) SpO2:  [98 %-100 %] 100 % (08/27 1743) Weight:  [188 lb (85.3 kg)] 188 lb (85.3 kg) (08/27 1112)  Physical Exam:  General: alert, cooperative and no distress Lochia: appropriate Uterine Fundus: firm Incision: healing well DVT Evaluation: No evidence of DVT seen on physical exam.   Recent Labs  11/14/16 0533  HGB 10.5*  HCT 30.7*    Assessment/Plan: Status post Cesarean section. Doing well postoperatively.  Continue current care.  Debra [name],Debra [name] 11/14/2016, 8:14 AM

## 2016-11-15 MED ORDER — OXYCODONE-ACETAMINOPHEN 5-325 MG PO TABS: 1.0000 | ORAL_TABLET | ORAL | 0 refills | Status: DC | PRN

## 2016-11-15 MED ORDER — IBUPROFEN 600 MG PO TABS: 600.0000 mg | ORAL_TABLET | Freq: Four times a day (QID) | ORAL | 0 refills | Status: DC

## 2016-11-15 NOTE — Discharge Summary (Signed)
Obstetric Discharge Summary Reason for Admission: cesarean section Prenatal Procedures: none Intrapartum Procedures: C/S Postpartum Procedures: none Complications-Operative and Postpartum: none Hemoglobin  Date Value Ref Range Status  11/14/2016 10.5 (L) 12.0 - 15.0 g/dL Final   HCT  Date Value Ref Range Status  11/14/2016 30.7 (L) 36.0 - 46.0 % Final    Physical Exam:  General: alert, cooperative, appears stated age and no distress Lochia: appropriate Uterine Fundus: firm Incision: healing well DVT Evaluation: No evidence of DVT seen on physical exam.  Discharge Diagnoses: Term Pregnancy-delivered  Discharge Information: Date: 11/15/2016 Activity: pelvic rest Diet: routine Medications: Ibuprofen and Percocet Condition: stable Instructions: refer to practice specific booklet Discharge to: home   Newborn Data: Live born female  Birth Weight: 7 lb 2.1 oz (3235 g) APGAR: 8, 8  Home with mother.  Debra [name] C 11/15/2016, 9:56 AM

## 2016-11-15 NOTE — Lactation Note (Signed)
This note was copied from a baby's chart. Lactation Consultation Note: Assist mother with application of #24 nipple shield. Infant latched on and off  With shallow latch. Mother is able to hand express small amts of colostrum. Infant was given 2 ml of ebm with a spoon. Discussed supplementing after each feeding with ebm. Mother is post pumping but has not gotten any colostrum. Mother does not want to supplement infant with formula at this time. Mother was given Alimentum and a curved tip syringe . Mother was given supplemental guidelines as needed. Mother to schedule a follow up visit with LC. Request for Lc out patient visit placed in basket. Mother was given [facility] office phone number. Mother advised to continue to post pump and supplement infant after each feeding. Encouraged mother to assess nipple shield for colostrum after feeding. Mother rented a [employer] pump for 2 weeks. Mother receptive to all teaching.   Patient Name: Girl Jalesia Sites ZWCHE'N Date: 11/15/2016 Reason for consult: Follow-up assessment   Maternal Data    Feeding Feeding Type: Breast Fed Length of feed: 7 min  LATCH Score Latch: Grasps breast easily, tongue down, lips flanged, rhythmical sucking.  Audible Swallowing: A few with stimulation  Type of Nipple: Inverted  Comfort (Breast/Nipple): Soft / non-tender  Hold (Positioning): Assistance needed to correctly position infant at breast and maintain latch.  LATCH Score: 6  Interventions    Lactation Tools Discussed/Used Tools: Nipple Shields Nipple shield size: 24 Shell Type: Inverted   Consult Status Consult Status: Follow-up Date: 11/15/16 Follow-up type: In-patient    Stevan Born [facility] 11/15/2016, 2:36 PM

## 2016-11-16 NOTE — Telephone Encounter (Signed)
Mom calls with additional questions

## 2017-05-28 ENCOUNTER — Encounter: Payer: Self-pay | Admitting: Adult Health

## 2017-05-28 NOTE — Progress Notes (Signed)
Encounter to establish care and Complete Physical  Assessment and Plan:  Debra [name] was seen today for establish care.  Diagnoses and all orders for this visit:  Encounter for medical examination to establish care  Encounter for routine adult health examination without abnormal findings  Medication management -     CBC with Differential/Platelet -     BASIC METABOLIC PANEL WITH GFR -     Hepatic function panel  Screening cholesterol level -     Lipid panel  Screening for cardiovascular condition -     EKG 12-Lead  Screening for deficiency anemia -     Vitamin B12  Screening for diabetes mellitus -     Hemoglobin A1c  Screening for hematuria or proteinuria -     Urinalysis w microscopic + reflex cultur  Screening for thyroid disorder -     TSH  Vitamin D deficiency -     VITAMIN D 25 Hydroxy (Vit-D Deficiency, Fractures)   Discussed med's effects and SE's. Screening labs and tests as requested with regular follow-up as recommended. Over 40 minutes of exam, counseling, chart review, and complex, high level critical decision making was performed this visit.   No future appointments.   HPI  32 y.o. 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 She does not have any active medical problems, but did have hydronephrosis without complication or intervention during her recent pregnancy. She has no concerns today. She was previously managed by OBGYN for feminine concerns but will be seeing [place] for this from now on (most recently had PAP/breast check 12/2016).   She sees a chiropractor occasionally, and goes to yoga at least once weekly. Plays soccer when weather allows. Drinks alcohol once weekly socially.   BMI is Body mass index is 22.38 kg/m., she has been working on diet and exercise. Wt Readings from Last 3 Encounters:  05/29/17 147 lb 3.2 oz (66.8 kg)  11/13/16 188 lb  (85.3 kg)  11/03/16 182 lb (82.6 kg)   Today their BP is BP: 110/64 She does workout. She denies chest pain, shortness of breath, dizziness.     Current Medications:  Current Outpatient Medications on File Prior to Visit  Medication Sig Dispense Refill  . ibuprofen (ADVIL,MOTRIN) 600 MG tablet Take 1 tablet (600 mg total) by mouth every 6 (six) hours. (Patient taking differently: Take 600 mg by mouth as needed. ) 30 tablet 0  . oxyCODONE-acetaminophen (PERCOCET/ROXICET) 5-325 MG tablet Take 1 tablet by mouth every 4 (four) hours as needed (pain scale 4-7). 30 tablet 0  . Prenatal Vit-Fe Fumarate-FA (PRENATAL MULTIVITAMIN) TABS tablet Take 1 tablet by mouth daily at 12 noon.    Kathrynn Running Estrad-Fe Biphas (LO LOESTRIN FE PO) Take by mouth.     No current facility-administered medications on file prior to visit.    Allergies:  No Known Allergies Medical History:  She does not have any active problems on file. Health Maintenance:   Immunization History  Administered Date(s) Administered  . Tdap 11/15/2016    Tetanus: 10/2016 Flu vaccine: 2018 HPV: 0/3  LMP: No LMP recorded. Pap: 12/2016 - by GYN MGM: -   Colonoscopy: -  EGD: -  Last Dental Exam: Dr. Sterling Big, 12/2016, sees q6 month Last Eye Exam: 2015 - no correction needed  Patient Care Team: [name], MD as PCP - General (Internal Medicine)  Surgical History:  She has a  past surgical history that includes Abdominal surgery; Anterior cruciate ligament repair (Left, 02/2015); and Cesarean section (N/A, 11/13/2016). Family History:  Herfamily history includes Alcohol abuse in her father and paternal grandfather; Alzheimer's disease in her paternal grandmother; Autoimmune disease in her paternal aunt; Breast cancer in her maternal grandmother and mother; Pancreatic cancer in her maternal uncle. Social History:  She reports that  has never smoked. she has never used smokeless tobacco. She reports that she drinks  about 1.2 oz of alcohol per week. She reports that she does not use drugs.  Review of Systems: Review of Systems  Constitutional: Negative for malaise/fatigue and weight loss.  HENT: Negative for hearing loss and tinnitus.   Eyes: Negative for blurred vision and double vision.  Respiratory: Negative for cough, sputum production, shortness of breath and wheezing.   Cardiovascular: Negative for chest pain, palpitations, orthopnea, claudication, leg swelling and PND.  Gastrointestinal: Negative for abdominal pain, blood in stool, constipation, diarrhea, heartburn, melena, nausea and vomiting.  Genitourinary: Negative.   Musculoskeletal: Negative for joint pain and myalgias.  Skin: Negative for rash.  Neurological: Negative for dizziness, tingling, sensory change, weakness and headaches.  Endo/Heme/Allergies: Negative for polydipsia.  Psychiatric/Behavioral: Negative.  Negative for depression, memory loss, substance abuse and suicidal ideas. The patient is not nervous/anxious and does not have insomnia.   All other systems reviewed and are negative.   Physical Exam: Estimated body mass index is 22.38 kg/m as calculated from the following:   Height as of this encounter: 5\' 8"  (1.727 m).   Weight as of this encounter: 147 lb 3.2 oz (66.8 kg). BP 110/64   Pulse 89   Temp (!) 97.5 F (36.4 C)   Ht 5\' 8"  (1.727 m)   Wt 147 lb 3.2 oz (66.8 kg)   SpO2 98%   Breastfeeding? Yes Comment: pumping  BMI 22.38 kg/m  General Appearance: Well nourished, in no apparent distress.  Eyes: PERRLA, EOMs, conjunctiva no swelling or erythema, normal fundi and vessels.  Sinuses: No Frontal/maxillary tenderness  ENT/Mouth: Ext aud canals clear, normal light reflex with TMs without erythema, bulging. Good dentition. No erythema, swelling, or exudate on post pharynx. Tonsils not swollen or erythematous. Hearing normal.  Neck: Supple, thyroid normal. No bruits  Respiratory: Respiratory effort normal, BS equal  bilaterally without rales, rhonchi, wheezing or stridor.  Cardio: RRR without murmurs, rubs or gallops. Brisk peripheral pulses without edema.  Chest: symmetric, with normal excursions and percussion.  Breasts: Defer  Abdomen: Soft, nontender, no guarding, rebound, hernias, masses, or organomegaly.  Lymphatics: Non tender without lymphadenopathy.  Genitourinary: Defer Musculoskeletal: Full ROM all peripheral extremities,5/5 strength, and normal gait.  Skin: Warm, dry without rashes, lesions, ecchymosis. Neuro: Cranial nerves intact, reflexes equal bilaterally. Normal muscle tone, no cerebellar symptoms. Sensation intact.  Psych: Awake and oriented X 3, normal affect, Insight and Judgment appropriate.   EKG: WNL, V1 RSR' - obtained for baseline   Dan MakerAshley C Courtland Coppa 9:32 AM [facility]

## 2017-05-29 ENCOUNTER — Ambulatory Visit (INDEPENDENT_AMBULATORY_CARE_PROVIDER_SITE_OTHER): Payer: BLUE CROSS/BLUE SHIELD | Admitting: Adult Health

## 2017-05-29 ENCOUNTER — Encounter: Payer: Self-pay | Admitting: Adult Health

## 2017-05-29 VITALS — BP 110/64 | HR 89 | Temp 97.5°F | Ht 68.0 in | Wt 147.2 lb

## 2017-05-29 DIAGNOSIS — Z131 Encounter for screening for diabetes mellitus: Secondary | ICD-10-CM | POA: Diagnosis not present

## 2017-05-29 DIAGNOSIS — Z0000 Encounter for general adult medical examination without abnormal findings: Secondary | ICD-10-CM | POA: Diagnosis not present

## 2017-05-29 DIAGNOSIS — Z1389 Encounter for screening for other disorder: Secondary | ICD-10-CM | POA: Diagnosis not present

## 2017-05-29 DIAGNOSIS — Z79899 Other long term (current) drug therapy: Secondary | ICD-10-CM | POA: Diagnosis not present

## 2017-05-29 DIAGNOSIS — Z136 Encounter for screening for cardiovascular disorders: Secondary | ICD-10-CM

## 2017-05-29 DIAGNOSIS — E559 Vitamin D deficiency, unspecified: Secondary | ICD-10-CM

## 2017-05-29 DIAGNOSIS — Z13220 Encounter for screening for lipoid disorders: Secondary | ICD-10-CM | POA: Diagnosis not present

## 2017-05-29 DIAGNOSIS — Z1329 Encounter for screening for other suspected endocrine disorder: Secondary | ICD-10-CM

## 2017-05-29 DIAGNOSIS — Z130 Encounter for screening for diseases of the blood and blood-forming organs and certain disorders involving the immune mechanism: Secondary | ICD-10-CM | POA: Diagnosis not present

## 2017-05-29 NOTE — Patient Instructions (Addendum)
Send me a message on mychart or give the office a call and ask to speak to my nurse if you ever have any questions or concerns!   General rules for women's health  Aim for 7+ servings of fruits and vegetables daily  80+ fluid ounces or 1/2 your body weight in fluid ounces of water or unsweet tea for healthy kidneys  1 drink of alcohol per day; limit as much as possible - less is better!  Limit animal fats in diet for cholesterol and heart health - choose grass fed whenever available if you do choose to consume animal products  Aim for low stress - take time to unwind and care for your mental health  Aim for 150 min of moderate intensity exercise weekly for heart health, and weights twice weekly for bone health  Aim for 7-9 hours of sleep daily   Human Papillomavirus Quadrivalent Vaccine suspension for injection What is this medicine? HUMAN PAPILLOMAVIRUS VACCINE (HYOO muhn pap uh LOH muh vahy ruhs vak SEEN) is a vaccine. It is used to prevent infections of four types of the human papillomavirus. In women, the vaccine may lower your risk of getting cervical, vaginal, vulvar, or anal cancer and genital warts. In men, the vaccine may lower your risk of getting genital warts and anal cancer. You cannot get these diseases from the vaccine. This vaccine does not treat these diseases. This medicine may be used for other purposes; ask your health care provider or pharmacist if you have questions. COMMON BRAND NAME(S): Gardasil What should I tell my health care provider before I take this medicine? They need to know if you have any of these conditions: -fever or infection -hemophilia -HIV infection or AIDS -immune system problems -low platelet count -an unusual reaction to Human Papillomavirus Vaccine, yeast, other medicines, foods, dyes, or preservatives -pregnant or trying to get pregnant -breast-feeding How should I use this medicine? This vaccine is for injection in a muscle on your  upper arm or thigh. It is given by a health care professional. Dennis Bast will be observed for 15 minutes after each dose. Sometimes, fainting happens after the vaccine is given. You may be asked to sit or lie down during the 15 minutes. Three doses are given. The second dose is given 2 months after the first dose. The last dose is given 4 months after the second dose. A copy of a Vaccine Information Statement will be given before each vaccination. Read this sheet carefully each time. The sheet may change frequently. Talk to your pediatrician regarding the use of this medicine in children. While this drug may be prescribed for children as young as 41 years of age for selected conditions, precautions do apply. Overdosage: If you think you have taken too much of this medicine contact a poison control center or emergency room at once. NOTE: This medicine is only for you. Do not share this medicine with others. What if I miss a dose? All 3 doses of the vaccine should be given within 6 months. Remember to keep appointments for follow-up doses. Your health care provider will tell you when to return for the next vaccine. Ask your health care professional for advice if you are unable to keep an appointment or miss a scheduled dose. What may interact with this medicine? -other vaccines This list may not describe all possible interactions. Give your health care provider a list of all the medicines, herbs, non-prescription drugs, or dietary supplements you use. Also tell them if you  smoke, drink alcohol, or use illegal drugs. Some items may interact with your medicine. What should I watch for while using this medicine? This vaccine may not fully protect everyone. Continue to have regular pelvic exams and cervical or anal cancer screenings as directed by your doctor. The Human Papillomavirus is a sexually transmitted disease. It can be passed by any kind of sexual activity that involves genital contact. The vaccine works  best when given before you have any contact with the virus. Many people who have the virus do not have any signs or symptoms. Tell your doctor or health care professional if you have any reaction or unusual symptom after getting the vaccine. What side effects may I notice from receiving this medicine? Side effects that you should report to your doctor or health care professional as soon as possible: -allergic reactions like skin rash, itching or hives, swelling of the face, lips, or tongue -breathing problems -feeling faint or lightheaded, falls Side effects that usually do not require medical attention (report to your doctor or health care professional if they continue or are bothersome): -cough -dizziness -fever -headache -nausea -redness, warmth, swelling, pain, or itching at site where injected This list may not describe all possible side effects. Call your doctor for medical advice about side effects. You may report side effects to FDA at 1-800-FDA-1088. Where should I keep my medicine? This drug is given in a hospital or clinic and will not be stored at home. NOTE: This sheet is a summary. It may not cover all possible information. If you have questions about this medicine, talk to your doctor, pharmacist, or health care provider.  2018 Elsevier/Gold Standard (2013-04-28 13:14:33)

## 2017-05-31 LAB — URINALYSIS W MICROSCOPIC + REFLEX CULTURE
Bacteria, UA: NONE SEEN /HPF
Bilirubin Urine: NEGATIVE
Glucose, UA: NEGATIVE
HGB URINE DIPSTICK: NEGATIVE
Hyaline Cast: NONE SEEN /LPF
KETONES UR: NEGATIVE
NITRITES URINE, INITIAL: NEGATIVE
PROTEIN: NEGATIVE
RBC / HPF: NONE SEEN /HPF (ref 0–2)
Specific Gravity, Urine: 1.03 (ref 1.001–1.03)
pH: 5.5 (ref 5.0–8.0)

## 2017-05-31 LAB — LIPID PANEL
CHOL/HDL RATIO: 3.5 (calc) (ref <5.0)
Cholesterol: 176 mg/dL (ref <200)
HDL: 51 mg/dL (ref >50)
LDL Cholesterol (Calc): 106 mg/dL (calc) — ABNORMAL HIGH
NON-HDL CHOLESTEROL (CALC): 125 mg/dL (ref <130)
TRIGLYCERIDES: 99 mg/dL (ref <150)

## 2017-05-31 LAB — URINE CULTURE
MICRO NUMBER: 90318923
RESULT: NO GROWTH
SPECIMEN QUALITY:: ADEQUATE

## 2017-05-31 LAB — CBC WITH DIFFERENTIAL/PLATELET
BASOS ABS: 60 {cells}/uL (ref 0–200)
BASOS PCT: 1.4 %
Eosinophils Absolute: 99 cells/uL (ref 15–500)
Eosinophils Relative: 2.3 %
HEMATOCRIT: 41.4 % (ref 35.0–45.0)
HEMOGLOBIN: 13.5 g/dL (ref 11.7–15.5)
LYMPHS ABS: 1746 {cells}/uL (ref 850–3900)
MCH: 27.8 pg (ref 27.0–33.0)
MCHC: 32.6 g/dL (ref 32.0–36.0)
MCV: 85.4 fL (ref 80.0–100.0)
MONOS PCT: 5.3 %
MPV: 9.8 fL (ref 7.5–12.5)
NEUTROS ABS: 2167 {cells}/uL (ref 1500–7800)
Neutrophils Relative %: 50.4 %
Platelets: 239 10*3/uL (ref 140–400)
RBC: 4.85 10*6/uL (ref 3.80–5.10)
RDW: 13 % (ref 11.0–15.0)
Total Lymphocyte: 40.6 %
WBC: 4.3 10*3/uL (ref 3.8–10.8)
WBCMIX: 228 {cells}/uL (ref 200–950)

## 2017-05-31 LAB — TSH: TSH: 0.73 m[IU]/L

## 2017-05-31 LAB — HEMOGLOBIN A1C
Hgb A1c MFr Bld: 5 % of total Hgb (ref <5.7)
Mean Plasma Glucose: 97 (calc)
eAG (mmol/L): 5.4 (calc)

## 2017-05-31 LAB — BASIC METABOLIC PANEL WITH GFR
BUN: 12 mg/dL (ref 7–25)
CO2: 25 mmol/L (ref 20–32)
CREATININE: 0.69 mg/dL (ref 0.50–1.10)
Calcium: 9.8 mg/dL (ref 8.6–10.2)
Chloride: 105 mmol/L (ref 98–110)
GFR, EST NON AFRICAN AMERICAN: 114 mL/min/{1.73_m2} (ref >=60)
GFR, Est African American: 132 mL/min/{1.73_m2} (ref >=60)
GLUCOSE: 81 mg/dL (ref 65–99)
Potassium: 4.4 mmol/L (ref 3.5–5.3)
Sodium: 141 mmol/L (ref 135–146)

## 2017-05-31 LAB — HEPATIC FUNCTION PANEL
AG RATIO: 1.7 (calc) (ref 1.0–2.5)
ALT: 12 U/L (ref 6–29)
AST: 14 U/L (ref 10–30)
Albumin: 4.8 g/dL (ref 3.6–5.1)
Alkaline phosphatase (APISO): 57 U/L (ref 33–115)
BILIRUBIN TOTAL: 0.9 mg/dL (ref 0.2–1.2)
Bilirubin, Direct: 0.1 mg/dL (ref 0.0–0.2)
GLOBULIN: 2.9 g/dL (ref 1.9–3.7)
Indirect Bilirubin: 0.8 mg/dL (calc) (ref 0.2–1.2)
TOTAL PROTEIN: 7.7 g/dL (ref 6.1–8.1)

## 2017-05-31 LAB — CULTURE INDICATED

## 2017-05-31 LAB — VITAMIN B12: VITAMIN B 12: 719 pg/mL (ref 200–1100)

## 2017-05-31 LAB — VITAMIN D 25 HYDROXY (VIT D DEFICIENCY, FRACTURES): Vit D, 25-Hydroxy: 43 ng/mL (ref 30–100)

## 2017-08-03 ENCOUNTER — Other Ambulatory Visit: Payer: Self-pay | Admitting: Adult Health

## 2017-08-03 ENCOUNTER — Encounter: Payer: Self-pay | Admitting: Adult Health

## 2017-08-03 DIAGNOSIS — Z01419 Encounter for gynecological examination (general) (routine) without abnormal findings: Secondary | ICD-10-CM

## 2017-08-09 DIAGNOSIS — Z3201 Encounter for pregnancy test, result positive: Secondary | ICD-10-CM | POA: Diagnosis not present

## 2017-08-09 DIAGNOSIS — Z3687 Encounter for antenatal screening for uncertain dates: Secondary | ICD-10-CM | POA: Diagnosis not present

## 2017-08-20 DIAGNOSIS — Z6821 Body mass index (BMI) 21.0-21.9, adult: Secondary | ICD-10-CM | POA: Diagnosis not present

## 2017-08-20 DIAGNOSIS — Z3201 Encounter for pregnancy test, result positive: Secondary | ICD-10-CM | POA: Diagnosis not present

## 2017-09-14 DIAGNOSIS — Z3689 Encounter for other specified antenatal screening: Secondary | ICD-10-CM | POA: Diagnosis not present

## 2017-09-14 DIAGNOSIS — Z118 Encounter for screening for other infectious and parasitic diseases: Secondary | ICD-10-CM | POA: Diagnosis not present

## 2017-09-14 DIAGNOSIS — Z3481 Encounter for supervision of other normal pregnancy, first trimester: Secondary | ICD-10-CM | POA: Diagnosis not present

## 2017-09-14 LAB — OB RESULTS CONSOLE ABO/RH: RH Type: POSITIVE

## 2017-09-14 LAB — OB RESULTS CONSOLE HEPATITIS B SURFACE ANTIGEN: Hepatitis B Surface Ag: NEGATIVE

## 2017-09-14 LAB — OB RESULTS CONSOLE RUBELLA ANTIBODY, IGM: Rubella: IMMUNE

## 2017-09-14 LAB — OB RESULTS CONSOLE GC/CHLAMYDIA
Chlamydia: NEGATIVE
Gonorrhea: NEGATIVE

## 2017-09-14 LAB — OB RESULTS CONSOLE RPR: RPR: NONREACTIVE

## 2017-09-14 LAB — OB RESULTS CONSOLE ANTIBODY SCREEN: ANTIBODY SCREEN: NEGATIVE

## 2017-09-14 LAB — OB RESULTS CONSOLE HIV ANTIBODY (ROUTINE TESTING): HIV: NONREACTIVE

## 2017-10-09 DIAGNOSIS — Z3482 Encounter for supervision of other normal pregnancy, second trimester: Secondary | ICD-10-CM | POA: Diagnosis not present

## 2017-10-23 DIAGNOSIS — Z361 Encounter for antenatal screening for raised alphafetoprotein level: Secondary | ICD-10-CM | POA: Diagnosis not present

## 2017-11-15 DIAGNOSIS — Z369 Encounter for antenatal screening, unspecified: Secondary | ICD-10-CM | POA: Diagnosis not present

## 2017-11-15 DIAGNOSIS — Z3482 Encounter for supervision of other normal pregnancy, second trimester: Secondary | ICD-10-CM | POA: Diagnosis not present

## 2017-12-14 DIAGNOSIS — Z3482 Encounter for supervision of other normal pregnancy, second trimester: Secondary | ICD-10-CM | POA: Diagnosis not present

## 2017-12-14 DIAGNOSIS — Z23 Encounter for immunization: Secondary | ICD-10-CM | POA: Diagnosis not present

## 2018-01-10 DIAGNOSIS — Z3689 Encounter for other specified antenatal screening: Secondary | ICD-10-CM | POA: Diagnosis not present

## 2018-01-10 DIAGNOSIS — Z3482 Encounter for supervision of other normal pregnancy, second trimester: Secondary | ICD-10-CM | POA: Diagnosis not present

## 2018-01-24 DIAGNOSIS — Z23 Encounter for immunization: Secondary | ICD-10-CM | POA: Diagnosis not present

## 2018-01-24 DIAGNOSIS — Z3483 Encounter for supervision of other normal pregnancy, third trimester: Secondary | ICD-10-CM | POA: Diagnosis not present

## 2018-01-24 DIAGNOSIS — Z3689 Encounter for other specified antenatal screening: Secondary | ICD-10-CM | POA: Diagnosis not present

## 2018-02-07 DIAGNOSIS — Z3483 Encounter for supervision of other normal pregnancy, third trimester: Secondary | ICD-10-CM | POA: Diagnosis not present

## 2018-02-18 DIAGNOSIS — Z3483 Encounter for supervision of other normal pregnancy, third trimester: Secondary | ICD-10-CM | POA: Diagnosis not present

## 2018-03-07 DIAGNOSIS — Z3685 Encounter for antenatal screening for Streptococcus B: Secondary | ICD-10-CM | POA: Diagnosis not present

## 2018-03-07 DIAGNOSIS — Z3483 Encounter for supervision of other normal pregnancy, third trimester: Secondary | ICD-10-CM | POA: Diagnosis not present

## 2018-03-11 ENCOUNTER — Other Ambulatory Visit: Payer: Self-pay | Admitting: Obstetrics

## 2018-03-14 DIAGNOSIS — Z3483 Encounter for supervision of other normal pregnancy, third trimester: Secondary | ICD-10-CM | POA: Diagnosis not present

## 2018-03-14 LAB — OB RESULTS CONSOLE GBS: GBS: NEGATIVE

## 2018-03-21 ENCOUNTER — Encounter (HOSPITAL_COMMUNITY): Payer: Self-pay | Admitting: *Deleted

## 2018-03-21 DIAGNOSIS — Z3483 Encounter for supervision of other normal pregnancy, third trimester: Secondary | ICD-10-CM | POA: Diagnosis not present

## 2018-03-28 DIAGNOSIS — Z3483 Encounter for supervision of other normal pregnancy, third trimester: Secondary | ICD-10-CM | POA: Diagnosis not present

## 2018-03-28 NOTE — H&P (Signed)
Debra [name] is a 33 y.o. G3P1011 at [redacted]w[redacted]d presenting for RCS. Pt notes intermittent contractions. Good fetal movement, No vaginal bleeding, not leaking fluid.  [facility] at [facility] since first trimester - dated by early u/s - PCS for breech, hopeful for VBAC but short interpreg interval <68mo from delivery to delivery so planning RCS    Prenatal Transfer Tool  Maternal Diabetes: No Genetic Screening: Normal Maternal Ultrasounds/Referrals: Normal Fetal Ultrasounds or other Referrals:  None Maternal Substance Abuse:  No Significant Maternal Medications:  None Significant Maternal Lab Results: None     OB History    Gravida  3   Para  1   Term  1   Preterm      AB  1   Living  1     SAB  1   TAB      Ectopic      Multiple  0   Live Births  1          Past Medical History:  Diagnosis Date  . Breech birth 11/13/2016  . Hydronephrosis    Past Surgical History:  Procedure Laterality Date  . ABDOMINAL SURGERY     age 33, exploratory? Not sure if appendix is out-  . ANTERIOR CRUCIATE LIGAMENT REPAIR Left 02/2015  . CESAREAN SECTION N/A 11/13/2016   Procedure: CESAREAN SECTION;  Surgeon: Zelphia Cairo, MD;  Location: [facility] SU[facility];  Service: Obstetrics;  Laterality: N/A;  Primary edc 11/17/16 NKDA need RNFA   Family History: family history includes Alcohol abuse in her father and paternal grandfather; Alzheimer's disease in her paternal grandmother; Autoimmune disease in her paternal aunt; Breast cancer in her maternal grandmother and mother; Pancreatic cancer in her maternal uncle. Social History:  reports that she has never smoked. She has never used smokeless tobacco. She reports current alcohol use of about 2.0 standard drinks of alcohol per week. She reports that she does not use drugs.  Review of Systems - Negative except pelvic pressure    Physical Exam:  Vitals:   03/30/18 0655  BP: 124/73  Pulse: 88  Temp: 98 F (36.7 C)   TempSrc: Oral  SpO2: 99%  Weight: 81.1 kg  Height: 5\' 8"  (1.727 m)    Gen: well appearing, no distress  Back: no CVAT Abd: gravid, NT, no RUQ pain LE: no edema, equal bilaterally, non-tender   Prenatal labs: ABO, Rh: O/Positive/-- (06/28 0000) Antibody: Negative (06/28 0000) Rubella: Immune (06/28 0000) RPR: Nonreactive (06/28 0000)  HBsAg: Negative (06/28 0000)  HIV: Non-reactive (06/28 0000)  GBS: Negative (12/26 0000)  1 hr Glucola 75  Genetic screening nl quad Anatomy US normal, gender not revealed  CBC    Component Value Date/Time   WBC 8.4 03/29/2018 0945   RBC 4.53 03/29/2018 0945   HGB 11.5 (L) 03/29/2018 0945   HCT 36.5 03/29/2018 0945   PLT 244 03/29/2018 0945   MCV 80.6 03/29/2018 0945   MCH 25.4 (L) 03/29/2018 0945   MCHC 31.5 03/29/2018 0945   RDW 14.4 03/29/2018 0945   LYMPHSABS 1,746 05/29/2017 1002   EOSABS 99 05/29/2017 1002   BASOSABS 60 05/29/2017 1002     Assessment/Plan: 33 y.o. G3P1011 at [redacted]w[redacted]d - RCS, no TL - GBS neg - short interpregnancy interval   Lendon Colonel 03/30/2018 8:10 AM

## 2018-03-29 ENCOUNTER — Encounter (HOSPITAL_COMMUNITY)
Admission: RE | Admit: 2018-03-29 | Discharge: 2018-03-29 | Disposition: A | Discharge status: Home / Self Care | Payer: BLUE CROSS/BLUE SHIELD | Source: Ambulatory Visit | Attending: Obstetrics | Admitting: Obstetrics

## 2018-03-29 DIAGNOSIS — O9081 Anemia of the puerperium: Secondary | ICD-10-CM | POA: Diagnosis not present

## 2018-03-29 DIAGNOSIS — Z3A39 39 weeks gestation of pregnancy: Secondary | ICD-10-CM | POA: Diagnosis not present

## 2018-03-29 DIAGNOSIS — D62 Acute posthemorrhagic anemia: Secondary | ICD-10-CM | POA: Diagnosis not present

## 2018-03-29 DIAGNOSIS — O34211 Maternal care for low transverse scar from previous cesarean delivery: Secondary | ICD-10-CM | POA: Diagnosis not present

## 2018-03-29 DIAGNOSIS — Z23 Encounter for immunization: Secondary | ICD-10-CM | POA: Diagnosis not present

## 2018-03-29 LAB — CBC
HCT: 36.5 % (ref 36.0–46.0)
Hemoglobin: 11.5 g/dL — ABNORMAL LOW (ref 12.0–15.0)
MCH: 25.4 pg — ABNORMAL LOW (ref 26.0–34.0)
MCHC: 31.5 g/dL (ref 30.0–36.0)
MCV: 80.6 fL (ref 80.0–100.0)
Platelets: 244 10*3/uL (ref 150–400)
RBC: 4.53 MIL/uL (ref 3.87–5.11)
RDW: 14.4 % (ref 11.5–15.5)
WBC: 8.4 10*3/uL (ref 4.0–10.5)
nRBC: 0 % (ref 0.0–0.2)

## 2018-03-29 LAB — TYPE AND SCREEN
ABO/RH(D): O POS
Antibody Screen: NEGATIVE

## 2018-03-29 NOTE — Patient Instructions (Signed)
Alechia KHILEY CARDULLO  03/29/2018   Your procedure is scheduled on:  03/30/2018  Enter through the Main Entrance of [facility] at 0630 AM.  Pick up the phone at the desk and dial [phone]  Call this number if you have problems the morning of surgery:[phone]  Remember:   Do not eat food:(After Midnight) Desps de medianoche.  Do not drink clear liquids: (After Midnight) Desps de medianoche.  Take these medicines the morning of surgery with A SIP OF WATER: none   Do not wear jewelry, make-up or nail polish.  Do not wear lotions, powders, or perfumes. Do not wear deodorant.  Do not shave 48 hours prior to surgery.  Do not bring valuables to the hospital.  [facility] is not   responsible for any belongings or valuables brought to the hospital.  Contacts, dentures or bridgework may not be worn into surgery.  Leave suitcase in the car. After surgery it may be brought to your room.  For patients admitted to the hospital, checkout time is 11:00 AM the day of              discharge.    N/A   Please read over the following fact sheets that you were given:   Surgical Site Infection Prevention

## 2018-03-30 ENCOUNTER — Inpatient Hospital Stay (HOSPITAL_COMMUNITY)
Admission: RE | Admit: 2018-03-30 | Discharge: 2018-04-01 | DRG: 787 | Disposition: A | Discharge status: Home / Self Care | Payer: BLUE CROSS/BLUE SHIELD | Attending: Obstetrics | Admitting: Obstetrics

## 2018-03-30 ENCOUNTER — Other Ambulatory Visit: Payer: Self-pay

## 2018-03-30 ENCOUNTER — Inpatient Hospital Stay (HOSPITAL_COMMUNITY): Payer: BLUE CROSS/BLUE SHIELD | Admitting: Anesthesiology

## 2018-03-30 ENCOUNTER — Encounter (HOSPITAL_COMMUNITY)
Admission: RE | Disposition: A | Discharge status: Home / Self Care | Payer: Self-pay | Source: Home / Self Care | Attending: Obstetrics

## 2018-03-30 ENCOUNTER — Encounter (HOSPITAL_COMMUNITY): Payer: Self-pay | Admitting: General Practice

## 2018-03-30 DIAGNOSIS — D62 Acute posthemorrhagic anemia: Secondary | ICD-10-CM | POA: Diagnosis not present

## 2018-03-30 DIAGNOSIS — O9081 Anemia of the puerperium: Secondary | ICD-10-CM | POA: Diagnosis not present

## 2018-03-30 DIAGNOSIS — Z3A39 39 weeks gestation of pregnancy: Secondary | ICD-10-CM | POA: Diagnosis not present

## 2018-03-30 DIAGNOSIS — O34211 Maternal care for low transverse scar from previous cesarean delivery: Principal | ICD-10-CM | POA: Diagnosis present

## 2018-03-30 LAB — RPR: RPR: NONREACTIVE

## 2018-03-30 SURGERY — Surgical Case
Anesthesia: Spinal

## 2018-03-30 MED ORDER — OXYCODONE HCL 5 MG/5ML PO SOLN: 5.0000 mg | Freq: Once | ORAL | Status: DC | PRN

## 2018-03-30 MED ORDER — OXYCODONE HCL 5 MG PO TABS: 5.0000 mg | ORAL_TABLET | Freq: Once | ORAL | Status: DC | PRN

## 2018-03-30 MED ORDER — TRIAMCINOLONE ACETONIDE 40 MG/ML IJ SUSP
INTRAMUSCULAR | Status: DC | PRN
  Administered 2018-03-30: 40 mg via INTRAMUSCULAR

## 2018-03-30 MED ORDER — OXYTOCIN 10 UNIT/ML IJ SOLN
INTRAVENOUS | Status: DC | PRN
  Administered 2018-03-30: 40 [IU] via INTRAVENOUS

## 2018-03-30 MED ORDER — FENTANYL CITRATE (PF) 100 MCG/2ML IJ SOLN: 25.0000 ug | INTRAMUSCULAR | Status: DC | PRN

## 2018-03-30 MED ORDER — LACTATED RINGERS IV SOLN
INTRAVENOUS | Status: DC | PRN
  Administered 2018-03-30: 09:00:00 via INTRAVENOUS

## 2018-03-30 MED ORDER — TETANUS-DIPHTH-ACELL PERTUSSIS 5-2.5-18.5 LF-MCG/0.5 IM SUSP: 0.5000 mL | Freq: Once | INTRAMUSCULAR | Status: DC

## 2018-03-30 MED ORDER — ACETAMINOPHEN 160 MG/5ML PO SOLN: 325.0000 mg | ORAL | Status: DC | PRN

## 2018-03-30 MED ORDER — IBUPROFEN 800 MG PO TABS
800.0000 mg | ORAL_TABLET | Freq: Three times a day (TID) | ORAL | Status: DC
  Administered 2018-03-30 – 2018-04-01 (×5): 800 mg via ORAL
  Filled 2018-03-30 (×5): qty 1

## 2018-03-30 MED ORDER — SIMETHICONE 80 MG PO CHEW: 80.0000 mg | CHEWABLE_TABLET | ORAL | Status: DC | PRN

## 2018-03-30 MED ORDER — LACTATED RINGERS IV SOLN: INTRAVENOUS | Status: DC

## 2018-03-30 MED ORDER — PHENYLEPHRINE 8 MG IN D5W 100 ML (0.08MG/ML) PREMIX OPTIME
INJECTION | INTRAVENOUS | Status: DC | PRN
  Administered 2018-03-30: 60 ug/min via INTRAVENOUS

## 2018-03-30 MED ORDER — MENTHOL 3 MG MT LOZG: 1.0000 | LOZENGE | OROMUCOSAL | Status: DC | PRN

## 2018-03-30 MED ORDER — FENTANYL CITRATE (PF) 100 MCG/2ML IJ SOLN
INTRAMUSCULAR | Status: AC
  Filled 2018-03-30: qty 2

## 2018-03-30 MED ORDER — OXYTOCIN 40 UNITS IN NORMAL SALINE INFUSION - SIMPLE MED: 2.5000 [IU]/h | INTRAVENOUS | Status: AC

## 2018-03-30 MED ORDER — DIPHENHYDRAMINE HCL 25 MG PO CAPS
25.0000 mg | ORAL_CAPSULE | Freq: Four times a day (QID) | ORAL | Status: DC | PRN
  Administered 2018-03-30: 25 mg via ORAL
  Filled 2018-03-30: qty 1

## 2018-03-30 MED ORDER — BUPIVACAINE HCL (PF) 0.5 % IJ SOLN
INTRAMUSCULAR | Status: DC | PRN
  Administered 2018-03-30: 20 mL

## 2018-03-30 MED ORDER — BUPIVACAINE HCL (PF) 0.5 % IJ SOLN
INTRAMUSCULAR | Status: AC
  Filled 2018-03-30: qty 30

## 2018-03-30 MED ORDER — ONDANSETRON HCL 4 MG/2ML IJ SOLN: 4.0000 mg | Freq: Three times a day (TID) | INTRAMUSCULAR | Status: DC | PRN

## 2018-03-30 MED ORDER — PRENATAL MULTIVITAMIN CH
1.0000 | ORAL_TABLET | Freq: Every day | ORAL | Status: DC
  Administered 2018-03-31: 1 via ORAL
  Filled 2018-03-30: qty 1

## 2018-03-30 MED ORDER — NALOXONE HCL 0.4 MG/ML IJ SOLN: 0.4000 mg | INTRAMUSCULAR | Status: DC | PRN

## 2018-03-30 MED ORDER — KETOROLAC TROMETHAMINE 30 MG/ML IJ SOLN
30.0000 mg | Freq: Four times a day (QID) | INTRAMUSCULAR | Status: AC | PRN
  Administered 2018-03-30: 30 mg via INTRAVENOUS

## 2018-03-30 MED ORDER — LACTATED RINGERS IV SOLN
INTRAVENOUS | Status: DC
  Administered 2018-03-30 (×2): via INTRAVENOUS

## 2018-03-30 MED ORDER — MORPHINE SULFATE (PF) 0.5 MG/ML IJ SOLN
INTRAMUSCULAR | Status: AC
  Filled 2018-03-30: qty 10

## 2018-03-30 MED ORDER — WITCH HAZEL-GLYCERIN EX PADS: 1.0000 "application " | MEDICATED_PAD | CUTANEOUS | Status: DC | PRN

## 2018-03-30 MED ORDER — COCONUT OIL OIL: 1.0000 "application " | TOPICAL_OIL | Status: DC | PRN

## 2018-03-30 MED ORDER — SIMETHICONE 80 MG PO CHEW
80.0000 mg | CHEWABLE_TABLET | Freq: Three times a day (TID) | ORAL | Status: DC
  Administered 2018-03-30 – 2018-04-01 (×6): 80 mg via ORAL
  Filled 2018-03-30 (×6): qty 1

## 2018-03-30 MED ORDER — FENTANYL CITRATE (PF) 100 MCG/2ML IJ SOLN
INTRAMUSCULAR | Status: DC | PRN
  Administered 2018-03-30: 15 ug via INTRATHECAL

## 2018-03-30 MED ORDER — KETOROLAC TROMETHAMINE 30 MG/ML IJ SOLN
INTRAMUSCULAR | Status: AC
  Filled 2018-03-30: qty 1

## 2018-03-30 MED ORDER — SODIUM CHLORIDE 0.9% FLUSH: 3.0000 mL | INTRAVENOUS | Status: DC | PRN

## 2018-03-30 MED ORDER — DIBUCAINE 1 % RE OINT: 1.0000 "application " | TOPICAL_OINTMENT | RECTAL | Status: DC | PRN

## 2018-03-30 MED ORDER — KETOROLAC TROMETHAMINE 30 MG/ML IJ SOLN: 30.0000 mg | Freq: Four times a day (QID) | INTRAMUSCULAR | Status: AC | PRN

## 2018-03-30 MED ORDER — MEPERIDINE HCL 25 MG/ML IJ SOLN: 6.2500 mg | INTRAMUSCULAR | Status: DC | PRN

## 2018-03-30 MED ORDER — CEFAZOLIN SODIUM-DEXTROSE 2-4 GM/100ML-% IV SOLN
2.0000 g | INTRAVENOUS | Status: AC
  Administered 2018-03-30: 2 g via INTRAVENOUS
  Filled 2018-03-30: qty 100

## 2018-03-30 MED ORDER — MORPHINE SULFATE (PF) 0.5 MG/ML IJ SOLN
INTRAMUSCULAR | Status: DC | PRN
  Administered 2018-03-30: .15 mg via INTRATHECAL

## 2018-03-30 MED ORDER — ZOLPIDEM TARTRATE 5 MG PO TABS: 5.0000 mg | ORAL_TABLET | Freq: Every evening | ORAL | Status: DC | PRN

## 2018-03-30 MED ORDER — OXYTOCIN 10 UNIT/ML IJ SOLN
INTRAMUSCULAR | Status: AC
  Filled 2018-03-30: qty 4

## 2018-03-30 MED ORDER — SCOPOLAMINE 1 MG/3DAYS TD PT72
MEDICATED_PATCH | TRANSDERMAL | Status: AC
  Filled 2018-03-30: qty 1

## 2018-03-30 MED ORDER — SENNOSIDES-DOCUSATE SODIUM 8.6-50 MG PO TABS
2.0000 | ORAL_TABLET | ORAL | Status: DC
  Administered 2018-03-31 (×2): 2 via ORAL
  Filled 2018-03-30 (×2): qty 2

## 2018-03-30 MED ORDER — OXYCODONE HCL 5 MG PO TABS: 5.0000 mg | ORAL_TABLET | ORAL | Status: DC | PRN

## 2018-03-30 MED ORDER — PHENYLEPHRINE 8 MG IN D5W 100 ML (0.08MG/ML) PREMIX OPTIME
INJECTION | INTRAVENOUS | Status: AC
  Filled 2018-03-30: qty 100

## 2018-03-30 MED ORDER — ONDANSETRON HCL 4 MG/2ML IJ SOLN
INTRAMUSCULAR | Status: DC | PRN
  Administered 2018-03-30: 4 mg via INTRAVENOUS

## 2018-03-30 MED ORDER — ONDANSETRON HCL 4 MG/2ML IJ SOLN: 4.0000 mg | Freq: Once | INTRAMUSCULAR | Status: DC | PRN

## 2018-03-30 MED ORDER — TRIAMCINOLONE ACETONIDE 40 MG/ML IJ SUSP
40.0000 mg | Freq: Once | INTRAMUSCULAR | Status: DC
  Filled 2018-03-30: qty 1

## 2018-03-30 MED ORDER — BUPIVACAINE IN DEXTROSE 0.75-8.25 % IT SOLN
INTRATHECAL | Status: DC | PRN
  Administered 2018-03-30: 1.6 mL via INTRATHECAL

## 2018-03-30 MED ORDER — ACETAMINOPHEN 325 MG PO TABS: 325.0000 mg | ORAL_TABLET | ORAL | Status: DC | PRN

## 2018-03-30 MED ORDER — ONDANSETRON HCL 4 MG/2ML IJ SOLN
INTRAMUSCULAR | Status: AC
  Filled 2018-03-30: qty 2

## 2018-03-30 MED ORDER — SIMETHICONE 80 MG PO CHEW
80.0000 mg | CHEWABLE_TABLET | ORAL | Status: DC
  Administered 2018-03-31 (×2): 80 mg via ORAL
  Filled 2018-03-30 (×2): qty 1

## 2018-03-30 MED ORDER — PHENYLEPHRINE 40 MCG/ML (10ML) SYRINGE FOR IV PUSH (FOR BLOOD PRESSURE SUPPORT)
PREFILLED_SYRINGE | INTRAVENOUS | Status: AC
  Filled 2018-03-30: qty 10

## 2018-03-30 SURGICAL SUPPLY — 33 items
BENZOIN TINCTURE PRP APPL 2/3 (GAUZE/BANDAGES/DRESSINGS) ×2 IMPLANT
CHLORAPREP W/TINT 26ML (MISCELLANEOUS) ×2 IMPLANT
CLAMP CORD UMBIL (MISCELLANEOUS) IMPLANT
CLOTH BEACON ORANGE TIMEOUT ST (SAFETY) ×2 IMPLANT
DRAPE C SECTION CLR SCREEN (DRAPES) ×2 IMPLANT
DRSG OPSITE POSTOP 4X10 (GAUZE/BANDAGES/DRESSINGS) ×2 IMPLANT
ELECT REM PT RETURN 9FT ADLT (ELECTROSURGICAL) ×2
ELECTRODE REM PT RTRN 9FT ADLT (ELECTROSURGICAL) ×1 IMPLANT
EXTRACTOR VACUUM M CUP 4 TUBE (SUCTIONS) IMPLANT
GLOVE BIO SURGEON STRL SZ 6.5 (GLOVE) ×2 IMPLANT
GLOVE BIOGEL PI IND STRL 7.0 (GLOVE) ×2 IMPLANT
GLOVE BIOGEL PI INDICATOR 7.0 (GLOVE) ×2
GOWN STRL REUS W/TWL LRG LVL3 (GOWN DISPOSABLE) ×4 IMPLANT
KIT ABG SYR 3ML LUER SLIP (SYRINGE) IMPLANT
NEEDLE HYPO 22GX1.5 SAFETY (NEEDLE) ×2 IMPLANT
NEEDLE HYPO 25X5/8 SAFETYGLIDE (NEEDLE) IMPLANT
NS IRRIG 1000ML POUR BTL (IV SOLUTION) ×2 IMPLANT
PACK C SECTION WH (CUSTOM PROCEDURE TRAY) ×2 IMPLANT
PAD OB MATERNITY 4.3X12.25 (PERSONAL CARE ITEMS) ×2 IMPLANT
PENCIL SMOKE EVAC W/HOLSTER (ELECTROSURGICAL) ×2 IMPLANT
SPONGE LAP 18X18 RF (DISPOSABLE) ×6 IMPLANT
STRIP CLOSURE SKIN 1/2X4 (GAUZE/BANDAGES/DRESSINGS) ×2 IMPLANT
SUT MON AB 4-0 PS1 27 (SUTURE) ×2 IMPLANT
SUT PLAIN 0 NONE (SUTURE) IMPLANT
SUT PLAIN 2 0 XLH (SUTURE) IMPLANT
SUT VIC AB 0 CT1 36 (SUTURE) ×4 IMPLANT
SUT VIC AB 0 CTX 36 (SUTURE) ×2
SUT VIC AB 0 CTX36XBRD ANBCTRL (SUTURE) ×2 IMPLANT
SUT VIC AB 2-0 CT1 27 (SUTURE) ×1
SUT VIC AB 2-0 CT1 TAPERPNT 27 (SUTURE) ×1 IMPLANT
SYR CONTROL 10ML LL (SYRINGE) ×2 IMPLANT
TOWEL OR 17X24 6PK STRL BLUE (TOWEL DISPOSABLE) ×2 IMPLANT
TRAY FOLEY W/BAG SLVR 14FR LF (SET/KITS/TRAYS/PACK) IMPLANT

## 2018-03-30 NOTE — Anesthesia Postprocedure Evaluation (Signed)
Anesthesia Post Note  Patient: Debra [name]  Procedure(s) Performed: Repeat CESAREAN SECTION (N/A )     Patient location during evaluation: Mother Baby Anesthesia Type: Spinal Level of consciousness: awake and alert Pain management: satisfactory to patient Vital Signs Assessment: post-procedure vital signs reviewed and stable Respiratory status: spontaneous breathing Cardiovascular status: stable Postop Assessment: no headache, spinal receding and patient able to bend at knees Anesthetic complications: no Comments: Pain score 0.    Last Vitals:  Vitals:   03/30/18 1330 03/30/18 1418  BP: 113/78 113/75  Pulse: 78 77  Resp: 18 18  Temp: 36.5 C 36.6 C  SpO2:  99%    Last Pain:  Vitals:   03/30/18 1418  TempSrc: Oral  PainSc:    Pain Goal:                 [facility] Eye InstituteWRINKLE,[name]

## 2018-03-30 NOTE — Addendum Note (Signed)
Addendum  created 03/30/18 1515 by Angela Adam, CRNA   Clinical Note Signed

## 2018-03-30 NOTE — Anesthesia Procedure Notes (Signed)
Spinal  Patient location during procedure: OR Start time: 03/30/2018 8:16 AM End time: 03/30/2018 8:20 AM Staffing Anesthesiologist: Bethena Midget, MD Preanesthetic Checklist Completed: patient identified, site marked, surgical consent, pre-op evaluation, timeout performed, IV checked, risks and benefits discussed and monitors and equipment checked Spinal Block Patient position: sitting Prep: DuraPrep Patient monitoring: heart rate, cardiac monitor, continuous pulse ox and blood pressure Approach: midline Location: L3-4 Injection technique: single-shot Needle Needle type: Sprotte  Needle gauge: 24 G Needle length: 9 cm Assessment Sensory level: T4

## 2018-03-30 NOTE — Anesthesia Preprocedure Evaluation (Signed)
Anesthesia Evaluation  Patient identified by MRN, date of birth, ID band Patient awake    Reviewed: Allergy & Precautions, H&P , NPO status , Patient's Chart, lab work & pertinent test results, reviewed documented beta blocker date and time   Airway Mallampati: I  TM Distance: >3 FB Neck ROM: full    Dental no notable dental hx. (+) Teeth Intact   Pulmonary neg pulmonary ROS,    Pulmonary exam normal breath sounds clear to auscultation       Cardiovascular negative cardio ROS Normal cardiovascular exam Rhythm:regular Rate:Normal     Neuro/Psych negative neurological ROS  negative psych ROS   GI/Hepatic negative GI ROS, Neg liver ROS,   Endo/Other  negative endocrine ROS  Renal/GU negative Renal ROS  negative genitourinary   Musculoskeletal   Abdominal   Peds  Hematology negative hematology ROS (+)   Anesthesia Other Findings   Reproductive/Obstetrics (+) Pregnancy                             Anesthesia Physical  Anesthesia Plan  ASA: II  Anesthesia Plan: Spinal   Post-op Pain Management:    Induction:   PONV Risk Score and Plan: 2 and Ondansetron, Dexamethasone and Scopolamine patch - Pre-op  Airway Management Planned: Nasal Cannula  Additional Equipment:   Intra-op Plan:   Post-operative Plan:   Informed Consent: I have reviewed the patients History and Physical, chart, labs and discussed the procedure including the risks, benefits and alternatives for the proposed anesthesia with the patient or authorized representative who has indicated his/her understanding and acceptance.     Plan Discussed with: CRNA, Anesthesiologist and Surgeon  Anesthesia Plan Comments:         Anesthesia Quick Evaluation

## 2018-03-30 NOTE — Transfer of Care (Signed)
Immediate Anesthesia Transfer of Care Note  Patient: Debra [name]  Procedure(s) Performed: Repeat CESAREAN SECTION (N/A )  Patient Location: PACU  Anesthesia Type:Spinal  Level of Consciousness: awake and alert   Airway & Oxygen Therapy: Patient Spontanous Breathing  Post-op Assessment: Report given to RN and Post -op Vital signs reviewed and stable  Post vital signs: Reviewed  Last Vitals:  Vitals Value Taken Time  BP 112/71 03/30/2018  9:45 AM  Temp    Pulse 102 03/30/2018  9:45 AM  Resp 16 03/30/2018  9:45 AM  SpO2 100 % 03/30/2018  9:45 AM  Vitals shown include unvalidated device data.  Last Pain:  Vitals:   03/30/18 0655  TempSrc: Oral  PainSc: 0-No pain         Complications: No apparent anesthesia complications

## 2018-03-30 NOTE — Anesthesia Postprocedure Evaluation (Signed)
Anesthesia Post Note  Patient: Debra [name]  Procedure(s) Performed: Repeat CESAREAN SECTION (N/A )     Patient location during evaluation: PACU Anesthesia Type: Spinal Level of consciousness: oriented and awake and alert Pain management: pain level controlled Vital Signs Assessment: post-procedure vital signs reviewed and stable Respiratory status: spontaneous breathing, respiratory function stable and patient connected to nasal cannula oxygen Cardiovascular status: blood pressure returned to baseline and stable Postop Assessment: no headache, no backache and no apparent nausea or vomiting Anesthetic complications: no    Last Vitals:  Vitals:   03/30/18 1015 03/30/18 1016  BP:  (!) 108/96  Pulse: 95 97  Resp: 20 (!) 23  Temp:    SpO2: 100% 97%    Last Pain:  Vitals:   03/30/18 1016  TempSrc:   PainSc: 0-No pain   Pain Goal:                 [name]

## 2018-03-30 NOTE — Op Note (Signed)
03/30/2018  10:02 AM  PATIENT:  Debra [name]  33 y.o. female  PRE-OPERATIVE DIAGNOSIS:  Previous cesarean section  POST-OPERATIVE DIAGNOSIS:  Previous cesarean section  PROCEDURE:  Procedure(s) with comments: Repeat CESAREAN SECTION (N/A) - EDC: 04/05/2018  Low-transverse cesarean section with 2 layer closure  SURGEON:  Surgeon(s) and Role:    * Noland Fordyce, MD - Primary  PHYSICIAN ASSISTANT:   ASSISTANTS: Ivonne Andrew, CNM   ANESTHESIA:   spinal  EBL:  325 mL   BLOOD ADMINISTERED:none  DRAINS: Urinary Catheter (Foley)   LOCAL MEDICATIONS USED:  MARCAINE    and Amount: 20 cc half percent mixed with 40 mg Kenalog ml  SPECIMEN:  Source of Specimen:  Placenta  DISPOSITION OF SPECIMEN:  Labor and delivery  COUNTS:  YES  TOURNIQUET:  * No tourniquets in log *  DICTATION: .Note written in EPIC  PLAN OF CARE: Admit to inpatient   PATIENT DISPOSITION:  PACU - hemodynamically stable.   Delay start of Pharmacological VTE agent (>24hrs) due to surgical blood loss or risk of bleeding: yes     Findings:  @BABYSEXEBC @ infant,  APGAR (1 MIN): 9   APGAR (5 MINS): 9   APGAR (10 MINS):   Normal uterus, tubes and ovaries, normal placenta. 3VC, clear amniotic fluid  EBL: Per nursing notes cc Antibiotics:   2g Ancef Complications: none  Indications: This is a 33 y.o. year-old, G2, P1 at [redacted]w[redacted]d admitted for repeat cesarean section.  Patient initially wanted attempt at trial of labor but given short interpregnancy interval cesarean section was recommended.. Risks benefits and alternatives of the procedure were discussed with the patient who agreed to proceed  Procedure:  After informed consent was obtained the patient was taken to the operating room where spinal anesthesia was initiated.  She was prepped and draped in the normal sterile fashion in dorsal supine position with a leftward tilt.  A foley catheter was in place.  An elliptical incision was made around the old  keloid/ Pfannenstiel skin incision, 2 cm above the pubic symphysis in the midline with the scalpel.  Thick scar was noted in the subcutaneous tissue.  Dissection was carried down with the Bovie cautery until the fascia was reached. The fascia was incised in the midline. The incision was extended laterally with the Mayo scissors. The inferior aspect of the fascial incision was grasped with the Coker clamps, elevated up and the underlying rectus muscles were dissected off sharply. The superior aspect of the fascial incision was grasped with the Coker clamps elevated up and the underlying rectus muscles were dissected off sharply.  The peritoneum was entered sharply. The peritoneal incision was extended superiorly and inferiorly with good visualization of the bladder. The bladder blade was inserted and palpation was done to assess the fetal position and the location of the uterine vessels. The lower segment of the uterus was incised sharply with the scalpel and extended  bluntly in the cephalo-caudal fashion. The infant was grasped, brought to the incision,  rotated and the infant was delivered with fundal pressure. The nose and mouth were bulb suctioned. The cord was clamped and cut after 1 minute delay. The infant was handed off to the waiting pediatrician. The placenta was expressed. The uterus was exteriorized. The uterus was cleared of all clots and debris. The uterine incision was repaired with 0 Vicryl in a running locked fashion.  A second layer of the same suture was used in an imbricating fashion to obtain excellent hemostasis.  The uterus was then returned to the abdomen, the gutters were cleared of all clots and debris. The uterine incision was reinspected and found to be hemostatic. The peritoneum was grasped and closed with 2-0 Vicryl in a running fashion. The cut muscle edges and the underside of the fascia were inspected and found to be hemostatic. The fascia was closed with 0 Vicryl in 2 halves. The  subcutaneous tissue was irrigated. Scarpa's layer was closed with a 2-0 plain gut suture. The skin was closed with a 4-0 Monocryl in a single layer. The patient tolerated the procedure well. Sponge lap and needle counts were correct x3 and patient was taken to the recovery room in a stable condition.  Lendon Colonel 03/30/2018 10:07 AM

## 2018-03-30 NOTE — Brief Op Note (Signed)
03/30/2018  10:02 AM  PATIENT:  Debra [name]  33 y.o. female  PRE-OPERATIVE DIAGNOSIS:  Previous cesarean section  POST-OPERATIVE DIAGNOSIS:  Previous cesarean section  PROCEDURE:  Procedure(s) with comments: Repeat CESAREAN SECTION (N/A) - EDC: 04/05/2018  Low-transverse cesarean section with 2 layer closure  SURGEON:  Surgeon(s) and Role:    * Noland Fordyce, MD - Primary  PHYSICIAN ASSISTANT:   ASSISTANTS: Ivonne Andrew, CNM   ANESTHESIA:   spinal  EBL:  325 mL   BLOOD ADMINISTERED:none  DRAINS: Urinary Catheter (Foley)   LOCAL MEDICATIONS USED:  MARCAINE    and Amount: 20 cc half percent mixed with 40 mg Kenalog ml  SPECIMEN:  Source of Specimen:  Placenta  DISPOSITION OF SPECIMEN:  Labor and delivery  COUNTS:  YES  TOURNIQUET:  * No tourniquets in log *  DICTATION: .Note written in EPIC  PLAN OF CARE: Admit to inpatient   PATIENT DISPOSITION:  PACU - hemodynamically stable.   Delay start of Pharmacological VTE agent (>24hrs) due to surgical blood loss or risk of bleeding: yes

## 2018-03-31 LAB — CBC
HCT: 29.5 % — ABNORMAL LOW (ref 36.0–46.0)
Hemoglobin: 9.3 g/dL — ABNORMAL LOW (ref 12.0–15.0)
MCH: 25.4 pg — ABNORMAL LOW (ref 26.0–34.0)
MCHC: 31.5 g/dL (ref 30.0–36.0)
MCV: 80.6 fL (ref 80.0–100.0)
PLATELETS: 190 10*3/uL (ref 150–400)
RBC: 3.66 MIL/uL — ABNORMAL LOW (ref 3.87–5.11)
RDW: 14.5 % (ref 11.5–15.5)
WBC: 9.1 10*3/uL (ref 4.0–10.5)
nRBC: 0 % (ref 0.0–0.2)

## 2018-03-31 LAB — BIRTH TISSUE RECOVERY COLLECTION (PLACENTA DONATION)

## 2018-03-31 NOTE — Lactation Note (Signed)
This note was copied from a baby's chart. Lactation Consultation Note  Patient Name: Debra [name] [id number] Date: 03/31/2018 Reason for consult: Follow-up assessment;Infant weight loss;Term  P2 mother whose infant is 60 hours old.  This is a term infant with an 8% weight loss.  In reviewing the chart, mother has not put the baby to breast since 0130 this morning.  When questioned about her feeding plan mother stated she wants to breast/bottle feed.  I had mentioned that I had noticed many bottle feedings and mother and father both stated that baby was breast feeding but "not getting anything."  I explained to them that we cannot always see colostrum but if baby was breastfeeding actively that he would be obtaining colostrum.  Parents were quite adamant (especially father) that their baby was not "getting anything" from mother.  I acknowledged their assumptions and reviewed basics regarding breastfeeding.  Supplementation guidelines were at the bedside and parents were formula feeding much more than the guidelines suggested.  I also acknowledged the volumes due to the weight loss and offered to watch baby latch and breast feed at the next feeding. Parents acknowledged my offer to help but did not act real interested in having me observe. I also noted that parents were using an artificial nipple to supplement.  I offered alternate feeding tools if parents did not want to use an artificial nipple to which father responded, "We didn't know we had other choices."  I briefly mentioned the choices and asked them to call the RN if they wanted to supplement with an alternative method.  At the present time they are using an artificial nipple.  Encouraged mother to continue feeding 8-12 times/24 hours or sooner if baby shows feeding cues.  Encouraged her to always put baby to breast before supplementing and explained the law of supply and demand.  She will call as needed for latch assistance.  I informed her  that I will be available to help her with whatever she decides to do related to feeding her baby.  I will guide her in the most appropriate way for her goals.    Mom made aware of O/P services, breastfeeding support groups, community resources, and our phone # for post-discharge questions. Mom made aware of O/P services, breastfeeding support groups, community resources, and our phone # for post-discharge questions.    Maternal Data Formula Feeding for Exclusion: No Has patient been taught Hand Expression?: Yes Does the patient have breastfeeding experience prior to this delivery?: Yes  Feeding Feeding Type: Bottle Fed - Formula  LATCH Score                   Interventions    Lactation Tools Discussed/Used     Consult Status Consult Status: Follow-up Date: 04/01/18 Follow-up type: In-patient    Dora Sims 03/31/2018, 3:44 PM

## 2018-03-31 NOTE — Progress Notes (Signed)
Patient ID: Debra [name], female   DOB: 10-Mar-1986, 33 y.o.   MRN: [id number] Subjective: POD# 1 Live born female  Birth Weight: 8 lb 1.1 oz (3660 g) APGAR: 9, 9  Newborn Delivery   Birth date/time:  03/30/2018 08:56:00 Delivery type:  C-Section, Low Transverse Trial of labor:  No C-section categorization:  Repeat    Baby name: [place] Delivering provider: Noland Fordyce   circumcision declined Feeding:breast  Pain control at delivery: Spinal   Reports feeling well.  Patient reports tolerating PO.   Breast symptoms: + colostrum Pain controlled with PO meds Denies HA/SOB/C/P/N/V/dizziness. Flatus present. She reports vaginal bleeding as normal, without clots.  She is ambulating, urinating without difficulty.     Objective:   VS:    Vitals:   03/30/18 1538 03/30/18 1930 03/30/18 2330 03/31/18 0400  BP: 121/78 107/76 112/73 106/65  Pulse: 83 84 87 85  Resp: 18 16 17 16   Temp:   98.4 F (36.9 C) 97.7 F (36.5 C)  TempSrc:   Oral Oral  SpO2:  100% 100% 98%  Weight:      Height:          Intake/Output Summary (Last 24 hours) at 03/31/2018 0930 Last data filed at 03/31/2018 0500 Gross per 24 hour  Intake -  Output 3625 ml  Net -3625 ml        Recent Labs    03/29/18 0945 03/31/18 0707  WBC 8.4 9.1  HGB 11.5* 9.3*  HCT 36.5 29.5*  PLT 244 190     Blood type: --/--/O POS (01/10 0945)  Rubella: Immune (06/28 0000)  Vaccines: TDaP UTD         Flu    UTD   Physical Exam:  General: alert, cooperative and no distress CV: Regular rate and rhythm Resp: clear Abdomen: soft, nontender, normal bowel sounds Incision: clean, dry and intact Uterine Fundus: firm, below umbilicus, nontender Lochia: minimal Ext: extremities normal, atraumatic, no cyanosis or edema      Assessment/Plan: 33 y.o.   POD# 1. [id number]                  Principal Problem:   R C/S 1/11 Active Problems:   Postpartum care following cesarean delivery   Doing well, stable.             Advance diet as tolerated Encourage rest when baby rests Breastfeeding support Encourage to ambulate Routine post-op care  Neta Mends, CNM, MSN 03/31/2018, 9:30 AM

## 2018-04-01 ENCOUNTER — Encounter (HOSPITAL_COMMUNITY): Payer: Self-pay

## 2018-04-01 MED ORDER — OXYCODONE HCL 5 MG PO TABS: 5.0000 mg | ORAL_TABLET | ORAL | 0 refills | Status: DC | PRN

## 2018-04-01 MED ORDER — POLYSACCHARIDE IRON COMPLEX 150 MG PO CAPS: 150.0000 mg | ORAL_CAPSULE | Freq: Every day | ORAL | 0 refills | Status: DC

## 2018-04-01 MED ORDER — MAGNESIUM OXIDE -MG SUPPLEMENT 400 (240 MG) MG PO TABS: 1.0000 | ORAL_TABLET | Freq: Every day | ORAL | 0 refills | Status: DC

## 2018-04-01 MED ORDER — IBUPROFEN 800 MG PO TABS: 800.0000 mg | ORAL_TABLET | Freq: Three times a day (TID) | ORAL | 0 refills | Status: DC

## 2018-04-01 NOTE — Discharge Summary (Signed)
OB Discharge Summary  Patient Name: Debra [name] DOB: July 06, 1985 MRN: [id number]  Date of admission: 03/30/2018  Admitting diagnosis: Previous cesarean section Intrauterine pregnancy: [redacted]w[redacted]d      Date of discharge: 04/01/2018    Discharge diagnosis: Term Pregnancy Delivered     Prenatal history: [id number]   EDC : 04/05/2018, by Other Basis  Pren[facility] care at [facility]  Primary provider :Ernestina Penna Prenatal course complicated by previous CS  Prenatal Labs: ABO, Rh: --/--/O POS (01/10 0945)  Antibody: NEG (01/10 0945) Rubella: Immune (06/28 0000)   RPR: Non Reactive (01/10 0945)  HBsAg: Negative (06/28 0000)  HIV: Non-reactive (06/28 0000)  GBS: Negative (12/26 0000)                                    Hospital course:  Sceduled C/S   33 y.o. yo [id number] at [redacted]w[redacted]d was admitted to the hospital 03/30/2018 for scheduled cesarean section with the following indication:Elective Repeat.  Membrane Rupture Time/Date: 8:55 AM ,03/30/2018  Patient delivered a Viable infant.03/30/2018  Details of operation can be found in separate operative note.    Patient had an uncomplicated postpartum course.  She is ambulating, tolerating a regular diet, passing flatus, and urinating well. Patient is discharged home in stable condition on  04/01/18  Delivering PROVIDER: Noland Fordyce                                                            Complications: None  Newborn Data: Live born female  Birth Weight: 8 lb 1.1 oz (3660 g) APGAR: 9, 9  Newborn Delivery   Birth date/time:  03/30/2018 08:56:00 Delivery type:  C-Section, Low Transverse Trial of labor:  No C-section categorization:  Repeat     Baby Feeding: Breast Disposition:home with mother  Post partum procedures:none  Labs: Lab Results  Component Value Date   WBC 9.1 03/31/2018   HGB 9.3 (L) 03/31/2018   HCT 29.5 (L) 03/31/2018   MCV 80.6 03/31/2018   PLT 190 03/31/2018   CMP Latest Ref Rng & Units  05/29/2017  Glucose 65 - 99 mg/dL 81  BUN 7 - 25 mg/dL 12  Creatinine 0.63 - 0.16 mg/dL 0.10  Sodium 932 - 355 mmol/L 141  Potassium 3.5 - 5.3 mmol/L 4.4  Chloride 98 - 110 mmol/L 105  CO2 20 - 32 mmol/L 25  Calcium 8.6 - 10.2 mg/dL 9.8  Total Protein 6.1 - 8.1 g/dL 7.7  Total Bilirubin 0.2 - 1.2 mg/dL 0.9  AST 10 - 30 U/L 14  ALT 6 - 29 U/L 12      Physical Exam @ time of discharge:  Vitals:   03/31/18 0400 03/31/18 1421 03/31/18 2131 04/01/18 0535  BP: 106/65 117/77 127/80 126/84  Pulse: 85 98 99 87  Resp: 16 18  18   Temp: 97.7 F (36.5 C) 98.6 F (37 C) 98.4 F (36.9 C) 97.7 F (36.5 C)  TempSrc: Oral Oral Oral Oral  SpO2: 98% 100% 99% 99%  Weight:      Height:        General: alert, cooperative and no distress Lochia: appropriate Uterine Fundus: firm Perineum: intact Incision: Healing well with no  significant drainage Extremities: DVT Evaluation: No evidence of DVT seen on physical exam.   Discharge instructions:  "Baby and Me Booklet" and [facility] Booklet  Discharge Medications:  Allergies as of 04/01/2018   No Known Allergies     Medication List    TAKE these medications   ibuprofen 800 MG tablet Commonly known as:  ADVIL,MOTRIN Take 1 tablet (800 mg total) by mouth every 8 (eight) hours. What changed:    medication strength  how much to take  when to take this   iron polysaccharides 150 MG capsule Commonly known as:  NIFEREX Take 1 capsule (150 mg total) by mouth daily.   Magnesium Oxide 400 (240 Mg) MG Tabs Take 1 tablet (400 mg total) by mouth daily.   oxyCODONE 5 MG immediate release tablet Commonly known as:  Oxy IR/ROXICODONE Take 1-2 tablets (5-10 mg total) by mouth every 4 (four) hours as needed for moderate pain.   prenatal multivitamin Tabs tablet Take 1 tablet by mouth daily at 12 noon.            Discharge Care Instructions  (From admission, onward)         Start     Ordered   04/01/18 0000  Discharge wound care:     Comments:  Leave honeycomb in place for 5 days - remove if get wet in shower. Leave steri-strips in place x 2 weeks. Keep incision clean and dry   04/01/18 1050          Diet: routine diet  Activity: Advance as tolerated. Pelvic rest x 6 weeks.   Follow up:6 weeks    Signed: Marlinda Mikeanya Greycen Felter CNM, MSN, [facility] CenterFACNM 04/01/2018, 10:59 AM

## 2018-04-01 NOTE — Progress Notes (Signed)
POSTOPERATIVE DAY # 2 S/P CS-repeat  S:         Reports feeling well - wants dc today as baby DC by Peds             Tolerating po intake / no nausea / no vomiting / + flatus / no BM             Bleeding is light             Pain controlled with motrin mostly - wants to avoid narcotic meds             Up ad lib / ambulatory/ voiding QS  Newborn Breast   O:  VS: BP 126/84 (BP Location: Right Arm)   Pulse 87   Temp 97.7 F (36.5 C) (Oral)   Resp 18   Ht 5\' 8"  (1.727 m)   Wt 81.1 kg   SpO2 99%   Breastfeeding Unknown   BMI 27.19 kg/m              Physical Exam:             Alert and Oriented X3  Lungs: Clear and unlabored  Heart: regular rate and rhythm / no mumurs  Abdomen: soft, non-tender, non-distended             Fundus: firm, non-tender, Ueven             Dressing intact              Incision:  approximated with suture / no erythema / no ecchymosis / no drainage  Perineum: intact  Lochia: light  Extremities: trace pedal edema, no calf pain or tenderness, negative Homans  A:        POD # 2 S/P Repeat CS            Post-op ABL anemia  P:        Routine postoperative care              DC home - WOB booklet - instructions reviewed   Marlinda Mike CNM, MSN, FACNM 04/01/2018, 9:00 AM

## 2018-04-01 NOTE — Lactation Note (Signed)
This note was copied from a baby's chart. Lactation Consultation Note  Patient Name: Debra [name] Date: 04/01/2018 Reason for consult: Follow-up assessment;Infant weight loss;Difficult latch;Term(8% weight loss / milk coming in )  Baby is 20 hours old  [place] reviewed doc flow sheets ( had already been updated )  As LC entered the room mom pumping with DEBP.  Per mom due to my inverted nipples - and the challenge with the NS .  Probably just going to pump and bottle feed like I ended up doing with my 1st baby.  It was just to much with the NS. Per mom has shells.  LC explored options and mentioned to mom if she is consistently pumping both breast  With the DEBP and wearing the shells / often the areola softens/ becomes compressible/  Nipple shield fits better .  LC reassured mom the [facility] services are here to support her for latching/ pumping / bottle feeding.  Per mom may try latching at home.  Denies sore nipples today, sore nipple and engorgement prevention and tx reviewed.  Per mom has DEBP at home/ also the DEBP pumping kit from hospital/ shells.  LC reviewed the volume per feeding and how it should progressively increase so by 7 days baby should  Take between 45- 60 ml per feeding ( 8 - 10 feedings a day ) for growth.  Mother informed of post-discharge support and given phone number to the lactation department, including services for phone call assistance; out-patient appointments; and breastfeeding support group. List of other breastfeeding resources in the community given in the handout. Encouraged mother to call for problems or concerns related to breastfeeding.   Maternal Data    Feeding Feeding Type: Formula Nipple Type: Slow - flow  LATCH Score                   Interventions Interventions: Breast feeding basics reviewed;Shells;DEBP  Lactation Tools Discussed/Used Tools: Pump Breast pump type: Double-Electric Breast Pump   Consult Status Consult  Status: Complete Date: 04/01/18    Myer Haff 04/01/2018, 11:47 AM

## 2018-05-13 DIAGNOSIS — Z124 Encounter for screening for malignant neoplasm of cervix: Secondary | ICD-10-CM | POA: Diagnosis not present

## 2018-05-13 DIAGNOSIS — Z113 Encounter for screening for infections with a predominantly sexual mode of transmission: Secondary | ICD-10-CM | POA: Diagnosis not present

## 2018-05-15 DIAGNOSIS — Z3042 Encounter for surveillance of injectable contraceptive: Secondary | ICD-10-CM | POA: Diagnosis not present

## 2018-06-07 ENCOUNTER — Encounter: Payer: Self-pay | Admitting: Adult Health

## 2018-08-20 DIAGNOSIS — Z3042 Encounter for surveillance of injectable contraceptive: Secondary | ICD-10-CM | POA: Diagnosis not present

## 2018-08-20 DIAGNOSIS — Z3202 Encounter for pregnancy test, result negative: Secondary | ICD-10-CM | POA: Diagnosis not present

## 2018-08-23 ENCOUNTER — Encounter: Payer: Self-pay | Admitting: Adult Health

## 2018-08-23 DIAGNOSIS — E785 Hyperlipidemia, unspecified: Secondary | ICD-10-CM | POA: Insufficient documentation

## 2018-08-23 DIAGNOSIS — D509 Iron deficiency anemia, unspecified: Secondary | ICD-10-CM | POA: Insufficient documentation

## 2018-08-23 DIAGNOSIS — E559 Vitamin D deficiency, unspecified: Secondary | ICD-10-CM | POA: Insufficient documentation

## 2018-08-23 NOTE — Progress Notes (Signed)
Encounter for Complete Physical  Assessment and Plan:   Encounter for routine adult health examination without abnormal findings  Medication management -     CBC with Differential/Platelet -     CMP/GFR  Mild hyperlipidemia Continue low cholesterol diet and exercise.  Check lipid panel.  -     Lipid panel  Anemia       Suspect iron deficiency anemia following pregnancy and c-sec       -     CBC       -     Iron panel   Screening for hematuria or proteinuria -     Urinalysis w microscopic + reflex cultur  Screening for thyroid disorder -     TSH  Vitamin D deficiency -     VITAMIN D 25 Hydroxy (Vit-D Deficiency, Fractures)   Discussed med's effects and SE's. Screening labs and tests as requested with regular follow-up as recommended. Over 40 minutes of exam, counseling, chart review, and complex, high level critical decision making was performed this visit.   Future Appointments  Date Time Provider Department Center  09/01/2019 10:00 AM Judd Gaudier, NP [facility] None     HPI  33 y.o. female, presents for a complete physical. She has R C/S 1/11; Hyperlipidemia, mild; Anemia; and Vitamin D deficiency on their problem list.   She has no complaints today.   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 Marland Kitchen Hx of hydronephrosis without complication or intervention during her first pregnancy. She is current pumping.   She follows with OBGYN for feminine concerns, last PAP 04/2018.   She sees a chiropractor occasionally, and goes to yoga at least once weekly (prior to covid). Plays soccer when weather allows. Drinks alcohol once weekly socially.   BMI is Body mass index is 23.08 kg/m., she has been working on diet and exercise. She 3-4 glasses of water daily.  Wt Readings from Last 3 Encounters:  08/26/18 154 lb (69.9 kg)  03/30/18 178 lb 12.8 oz (81.1 kg)  03/21/18 176 lb  (79.8 kg)   Today their BP is BP: 98/64 She does workout. She denies chest pain, shortness of breath, dizziness.    She is not on cholesterol medication and denies myalgias. Her cholesterol is not at goal. The cholesterol last visit was:   Lab Results  Component Value Date   CHOL 176 05/29/2017   HDL 51 05/29/2017   LDLCALC 106 (H) 05/29/2017   TRIG 99 05/29/2017   CHOLHDL 3.5 05/29/2017    She has been working on diet and exercise for glucose management, and denies increased appetite, nausea, paresthesia of the feet, polydipsia, polyuria and visual disturbances. Last A1C in the office was:  Lab Results  Component Value Date   HGBA1C 5.0 05/29/2017   Patient is not currently on Vitamin D supplement.   Lab Results  Component Value Date   VD25OH 43 05/29/2017      Anemic following pregnancy and c section, completed 1 month of iron supplement via GYN.  Lab Results  Component Value Date   WBC 9.1 03/31/2018   HGB 9.3 (L) 03/31/2018   HCT 29.5 (L) 03/31/2018   MCV 80.6 03/31/2018   PLT 190 03/31/2018      Current Medications:  Current Outpatient Medications on File Prior to Visit  Medication Sig Dispense Refill  . Prenatal Vit-Fe Fumarate-FA (PRENATAL MULTIVITAMIN) TABS tablet Take  1 tablet by mouth daily at 12 noon.     No current facility-administered medications on file prior to visit.    Allergies:  No Known Allergies Medical History:  She has R C/S 1/11; Hyperlipidemia, mild; Anemia; and Vitamin D deficiency on their problem list. Health Maintenance:   Immunization History  Administered Date(s) Administered  . Influenza-Unspecified 12/14/2017  . Tdap 11/15/2016, 01/24/2018    Tetanus: 01/2018 Flu vaccine: 2019 HPV: 0/3  LMP: No LMP recorded. (Menstrual status: Other). Pap: 04/2018 - by GYN MGM: -   Colonoscopy: -  EGD: -  Last Dental Exam: Dr. Sterling BigKwon, 12/2017, sees q6 month Last Eye Exam: 2015 - no correction needed  Patient Care Team: Lucky CowboyMcKeown,  William, MD as PCP - General (Internal Medicine)  Surgical History:  She has a past surgical history that includes Abdominal surgery; Anterior cruciate ligament repair (Left, 02/2015); Cesarean section (N/A, 11/13/2016); and Cesarean section (N/A, 03/30/2018). Family History:  Herfamily history includes Alcohol abuse in her father and paternal grandfather; Alzheimer's disease in her paternal grandmother; Autoimmune disease in her paternal aunt; Breast cancer in her maternal grandmother and mother; Pancreatic cancer in her maternal uncle. Social History:  She reports that she has never smoked. She has never used smokeless tobacco. She reports current alcohol use of about 2.0 standard drinks of alcohol per week. She reports that she does not use drugs.  Review of Systems: Review of Systems  Constitutional: Negative for malaise/fatigue and weight loss.  HENT: Negative for hearing loss and tinnitus.   Eyes: Negative for blurred vision and double vision.  Respiratory: Negative for cough, sputum production, shortness of breath and wheezing.   Cardiovascular: Negative for chest pain, palpitations, orthopnea, claudication, leg swelling and PND.  Gastrointestinal: Negative for abdominal pain, blood in stool, constipation, diarrhea, heartburn, melena, nausea and vomiting.  Genitourinary: Negative.   Musculoskeletal: Negative for joint pain and myalgias.  Skin: Negative for rash.  Neurological: Negative for dizziness, tingling, sensory change, weakness and headaches.  Endo/Heme/Allergies: Negative for polydipsia.  Psychiatric/Behavioral: Negative.  Negative for depression, memory loss, substance abuse and suicidal ideas. The patient is not nervous/anxious and does not have insomnia.   All other systems reviewed and are negative.   Physical Exam: Estimated body mass index is 23.08 kg/m as calculated from the following:   Height as of this encounter: 5' 8.5" (1.74 m).   Weight as of this encounter:  154 lb (69.9 kg). BP 98/64   Pulse 84   Temp 97.9 F (36.6 C)   Ht 5' 8.5" (1.74 m)   Wt 154 lb (69.9 kg)   SpO2 98%   BMI 23.08 kg/m  General Appearance: Well nourished, in no apparent distress.  Eyes: PERRLA, EOMs, conjunctiva no swelling or erythema, normal fundi and vessels.  Sinuses: No Frontal/maxillary tenderness  ENT/Mouth: Ext aud canals clear, normal light reflex with TMs without erythema, bulging. Good dentition. No erythema, swelling, or exudate on post pharynx. Tonsils not swollen or erythematous. Hearing normal.  Neck: Supple, thyroid normal. No bruits  Respiratory: Respiratory effort normal, BS equal bilaterally without rales, rhonchi, wheezing or stridor.  Cardio: RRR without murmurs, rubs or gallops. Brisk peripheral pulses without edema.  Chest: symmetric, with normal excursions and percussion.  Breasts: Defer  Abdomen: Soft, nontender, no guarding, rebound, hernias, masses, or organomegaly.  Lymphatics: Non tender without lymphadenopathy.  Genitourinary: Defer Musculoskeletal: Full ROM all peripheral extremities,5/5 strength, and normal gait.  Skin: Warm, dry without rashes, lesions, ecchymosis. She has well healed surgical  scar suprapubic, transverse without erythema or tenderness.  Neuro: Cranial nerves intact, reflexes equal bilaterally. Normal muscle tone, no cerebellar symptoms. Sensation intact.  Psych: Awake and oriented X 3, normal affect, Insight and Judgment appropriate.   EKG: WNL, V1 RSR' for baseline in chart reviewed; defer   Dan Maker 10:34 AM [facility]

## 2018-08-26 ENCOUNTER — Other Ambulatory Visit: Payer: Self-pay

## 2018-08-26 ENCOUNTER — Ambulatory Visit (INDEPENDENT_AMBULATORY_CARE_PROVIDER_SITE_OTHER): Payer: BC Managed Care – PPO | Admitting: Adult Health

## 2018-08-26 ENCOUNTER — Encounter: Payer: Self-pay | Admitting: Adult Health

## 2018-08-26 VITALS — BP 98/64 | HR 84 | Temp 97.9°F | Ht 68.5 in | Wt 154.0 lb

## 2018-08-26 DIAGNOSIS — Z79899 Other long term (current) drug therapy: Secondary | ICD-10-CM

## 2018-08-26 DIAGNOSIS — Z1389 Encounter for screening for other disorder: Secondary | ICD-10-CM | POA: Diagnosis not present

## 2018-08-26 DIAGNOSIS — E559 Vitamin D deficiency, unspecified: Secondary | ICD-10-CM | POA: Diagnosis not present

## 2018-08-26 DIAGNOSIS — Z13220 Encounter for screening for lipoid disorders: Secondary | ICD-10-CM

## 2018-08-26 DIAGNOSIS — Z0000 Encounter for general adult medical examination without abnormal findings: Secondary | ICD-10-CM

## 2018-08-26 DIAGNOSIS — Z1329 Encounter for screening for other suspected endocrine disorder: Secondary | ICD-10-CM

## 2018-08-26 DIAGNOSIS — E785 Hyperlipidemia, unspecified: Secondary | ICD-10-CM

## 2018-08-26 DIAGNOSIS — Z130 Encounter for screening for diseases of the blood and blood-forming organs and certain disorders involving the immune mechanism: Secondary | ICD-10-CM | POA: Diagnosis not present

## 2018-08-26 DIAGNOSIS — D649 Anemia, unspecified: Secondary | ICD-10-CM

## 2018-08-26 NOTE — Patient Instructions (Addendum)
Debra [name] , Thank you for taking time to come for your Annual Wellness Visit. I appreciate your ongoing commitment to your health goals. Please review the following plan we discussed and let me know if I can assist you in the future.   These are the goals we discussed: Goals    . Exercise 150 min/wk Moderate Activity    . LDL CALC < 100       This is a list of the screening recommended for you and due dates:  Health Maintenance  Topic Date Due  . Flu Shot  10/19/2018  . Pap Smear  12/19/2019  . Tetanus Vaccine  01/25/2028  . HIV Screening  Completed    Ask insurance about HPV vaccines (gardesil)   HPV (Human Papillomavirus) Vaccine: What You Need to Know 1. Why get vaccinated? HPV vaccine prevents infection with human papillomavirus (HPV) types that are associated with many cancers, including:  cervical cancer in females,  vaginal and vulvar cancers in females,  anal cancer in females and males,  throat cancer in females and males, and  penile cancer in males. In addition, HPV vaccine prevents infection with HPV types that cause genital warts in both females and males. In the [place] about 12,000 women get cervical cancer every year, and about 4,000 women die from it. HPV vaccine can prevent most of these cases of cervical cancer. Vaccination is not a substitute for cervical cancer screening. This vaccine does not protect against all HPV types that can cause cervical cancer. Women should still get regular Pap tests. HPV infection usually comes from sexual contact, and most people will become infected at some point in their life. About 14 million Americans, including teens, get infected every year. Most infections will go away on their own and not cause serious problems. But thousands of women and men get cancer and other diseases from HPV. 2. HPV vaccine HPV vaccine is approved by FDA and is recommended by CDC for both males and females. It is routinely given at 78 or 35  years of age, but it may be given beginning at age 48 years through age 36 years. Most adolescents 9 through 35 years of age should get HPV vaccine as a two-dose series with the doses separated by 6-12 months. People who start HPV vaccination at 51 years of age and older should get the vaccine as a three-dose series with the second dose given 1-2 months after the first dose and the third dose given 6 months after the first dose. There are several exceptions to these age recommendations. Your health care provider can give you more information. 3. Some people should not get this vaccine  Anyone who has had a severe (life-threatening) allergic reaction to a dose of HPV vaccine should not get another dose.  Anyone who has a severe (life threatening) allergy to any component of HPV vaccine should not get the vaccine. ? Tell your doctor if you have any severe allergies that you know of, including a severe allergy to yeast.  HPV vaccine is not recommended for pregnant women. If you learn that you were pregnant when you were vaccinated, there is no reason to expect any problems for you or your baby. Any woman who learns she was pregnant when she got HPV vaccine is encouraged to contact the manufacturer's registry for HPV vaccination during pregnancy at [phone]. Women who are breastfeeding may be vaccinated.  If you have a mild illness, such as a cold, you can probably get  the vaccine today. If you are moderately or severely ill, you should probably wait until you recover. Your doctor can advise you. 4. Risks of a vaccine reaction With any medicine, including vaccines, there is a chance of side effects. These are usually mild and go away on their own, but serious reactions are also possible. Most people who get HPV vaccine do not have any serious problems with it. Mild or moderate problems following HPV vaccine:  Reactions in the arm where the shot was given: ? Soreness (about 9 people in  10) ? Redness or swelling (about 1 person in 3)  Fever: ? Mild (100F) (about 1 person in 10) ? Moderate (102F) (about 1 person in 5965)  Other problems: ? Headache (about 1 person in 3) Problems that could happen after any injected vaccine:  People sometimes faint after a medical procedure, including vaccination. Sitting or lying down for about 15 minutes can help prevent fainting, and injuries caused by a fall. Tell your doctor if you feel dizzy, or have vision changes or ringing in the ears.  Some people get severe pain in the shoulder and have difficulty moving the arm where a shot was given. This happens very rarely.  Any medication can cause a severe allergic reaction. Such reactions from a vaccine are very rare, estimated at about 1 in a million doses, and would happen within a few minutes to a few hours after the vaccination. As with any medicine, there is a very remote chance of a vaccine causing a serious injury or death. The safety of vaccines is always being monitored. For more information, visit: http://floyd.org/www.cdc.gov/vaccinesafety/. 5. What if there is a serious reaction? What should I look for? Look for anything that concerns you, such as signs of a severe allergic reaction, very high fever, or unusual behavior. Signs of a severe allergic reaction can include hives, swelling of the face and throat, difficulty breathing, a fast heartbeat, dizziness, and weakness. These would usually start a few minutes to a few hours after the vaccination. What should I do? If you think it is a severe allergic reaction or other emergency that can't wait, call 9-1-1 or get to the nearest hospital. Otherwise, call your doctor. Afterward, the reaction should be reported to the Vaccine Adverse Event Reporting System (VAERS). Your doctor should file this report, or you can do it yourself through the VAERS web site at www.vaers.LAgents.nohhs.gov, or by calling 1-253-864-4421. VAERS does not give medical advice. 6. The  National Vaccine Injury Compensation Program The [employer] Vaccine Injury Compensation Program (VICP) is a federal program that was created to compensate people who may have been injured by certain vaccines. Persons who believe they may have been injured by a vaccine can learn about the program and about filing a claim by calling 1-332-382-1720 or visiting the VICP website at SpiritualWord.atwww.hrsa.gov/vaccinecompensation. There is a time limit to file a claim for compensation. 7. How can I learn more?  Ask your health care provider. He or she can give you the vaccine package insert or suggest other sources of information.  Call your local or state health department.  Contact the Centers for Disease Control and Prevention (CDC): ? Call 204-108-60591-(949)716-9322 (1-800-CDC-INFO) or ? Visit CDC's website at RunningConvention.dewww.cdc.gov/hpv Vaccine Information Statement HPV Vaccine (02/19/2015) This information is not intended to replace advice given to you by your health care provider. Make sure you discuss any questions you have with your health care provider. Document Released: 10/01/2013 Document Revised: 10/16/2017 Document Reviewed: 10/16/2017 Elsevier  Interactive Patient Education  2019 Elsevier Inc.     Preventing High Cholesterol Cholesterol is a waxy, fat-like substance that your body needs in small amounts. Your liver makes all the cholesterol that your body needs. Having high cholesterol (hypercholesterolemia) increases your risk for heart disease and stroke. Extra (excess) cholesterol comes from the food you eat, such as animal-based fat (saturated fat) from meat and some dairy products. High cholesterol can often be prevented with diet and lifestyle changes. If you already have high cholesterol, you can control it with diet and lifestyle changes, as well as medicine. What nutrition changes can be made?  Eat less saturated fat. Foods that contain saturated fat include red meat and some dairy products.  Avoid processed  meats, like bacon and lunch meats.  Avoid trans fats, which are found in margarine and some baked goods.  Avoid foods and beverages that have added sugars.  Eat more fruits, vegetables, and whole grains.  Choose healthy sources of protein, such as fish, poultry, and nuts.  Choose healthy sources of fat, such as: ? Nuts. ? Vegetable oils, especially olive oil. ? Fish that have healthy fats (omega-3 fatty acids), such as mackerel or salmon. What lifestyle changes can be made?   Lose weight if you are overweight. Losing 5-10 lb (2.3-4.5 kg) can help prevent or control high cholesterol and reduce your risk for diabetes and high blood pressure. Ask your health care provider to help you with a diet and exercise plan to safely lose weight.  Get enough exercise. Do at least 150 minutes of moderate-intensity exercise each week. ? You could do this in short exercise sessions several times a day, or you could do longer exercise sessions a few times a week. For example, you could take a brisk 10-minute walk or bike ride, 3 times a day, for 5 days a week.  Do not smoke. If you need help quitting, ask your health care provider.  Limit your alcohol intake. If you drink alcohol, limit alcohol intake to no more than 1 drink a day for nonpregnant women and 2 drinks a day for men. One drink equals 12 oz of beer, 5 oz of wine, or 1 oz of hard liquor. Why are these changes important?  If you have high cholesterol, deposits (plaques) may build up on the walls of your blood vessels. Plaques make the arteries narrower and stiffer, which can restrict or block blood flow and cause blood clots to form. This greatly increases your risk for heart attack and stroke. Making diet and lifestyle changes can reduce your risk for these life-threatening conditions. What can I do to lower my risk?  Manage your risk factors for high cholesterol. Talk with your health care provider about all of your risk factors and how to  lower your risk.  Manage other conditions that you have, such as diabetes or high blood pressure (hypertension).  Have your cholesterol checked at regular intervals.  Keep all follow-up visits as told by your health care provider. This is important. How is this treated? In addition to diet and lifestyle changes, your health care provider may recommend medicines to help lower cholesterol, such as a medicine to reduce the amount of cholesterol made in your liver. You may need medicine if:  Diet and lifestyle changes do not lower your cholesterol enough.  You have high cholesterol and other risk factors for heart disease or stroke. Take over-the-counter and prescription medicines only as told by your health care provider. Where to  find more information  American Heart Association: 1122334455www.heart.org/HEARTORG/Conditions/Cholesterol/Cholesterol_UCM_001089_SubHomePage.jsp  National Heart, Lung, and Blood Institute: http://hood.com/www.nhlbi.nih.gov/health/resources/heart/heart-cholesterol-hbc-what-html Summary  High cholesterol increases your risk for heart disease and stroke. By keeping your cholesterol level low, you can reduce your risk for these conditions.  Diet and lifestyle changes are the most important steps in preventing high cholesterol.  Work with your health care provider to manage your risk factors, and have your blood tested regularly. This information is not intended to replace advice given to you by your health care provider. Make sure you discuss any questions you have with your health care provider. Document Released: 03/21/2015 Document Revised: 11/13/2015 Document Reviewed: 11/13/2015 Elsevier Interactive Patient Education  2019 [employer]

## 2018-08-27 ENCOUNTER — Encounter: Payer: Self-pay | Admitting: Adult Health

## 2018-08-27 LAB — LIPID PANEL
Cholesterol: 177 mg/dL (ref <200)
HDL: 49 mg/dL — ABNORMAL LOW (ref >=50)
LDL Cholesterol (Calc): 113 mg/dL (calc) — ABNORMAL HIGH
Non-HDL Cholesterol (Calc): 128 mg/dL (calc) (ref <130)
Total CHOL/HDL Ratio: 3.6 (calc) (ref <5.0)
Triglycerides: 67 mg/dL (ref <150)

## 2018-08-27 LAB — CBC WITH DIFFERENTIAL/PLATELET
Absolute Monocytes: 281 cells/uL (ref 200–950)
Basophils Absolute: 42 cells/uL (ref 0–200)
Basophils Relative: 0.8 %
Eosinophils Absolute: 62 cells/uL (ref 15–500)
Eosinophils Relative: 1.2 %
HCT: 37.7 % (ref 35.0–45.0)
Hemoglobin: 12.6 g/dL (ref 11.7–15.5)
Lymphs Abs: 1966 cells/uL (ref 850–3900)
MCH: 27.1 pg (ref 27.0–33.0)
MCHC: 33.4 g/dL (ref 32.0–36.0)
MCV: 81.1 fL (ref 80.0–100.0)
MPV: 10.7 fL (ref 7.5–12.5)
Monocytes Relative: 5.4 %
Neutro Abs: 2850 cells/uL (ref 1500–7800)
Neutrophils Relative %: 54.8 %
Platelets: 241 10*3/uL (ref 140–400)
RBC: 4.65 10*6/uL (ref 3.80–5.10)
RDW: 13.2 % (ref 11.0–15.0)
Total Lymphocyte: 37.8 %
WBC: 5.2 10*3/uL (ref 3.8–10.8)

## 2018-08-27 LAB — URINALYSIS, ROUTINE W REFLEX MICROSCOPIC
Bacteria, UA: NONE SEEN /HPF
Bilirubin Urine: NEGATIVE
Glucose, UA: NEGATIVE
Hgb urine dipstick: NEGATIVE
Hyaline Cast: NONE SEEN /LPF
Ketones, ur: NEGATIVE
Nitrite: NEGATIVE
Protein, ur: NEGATIVE
RBC / HPF: NONE SEEN /HPF (ref 0–2)
Specific Gravity, Urine: 1.025 (ref 1.001–1.03)
pH: 5.5 (ref 5.0–8.0)

## 2018-08-27 LAB — COMPLETE METABOLIC PANEL WITH GFR
AG Ratio: 1.7 (calc) (ref 1.0–2.5)
ALT: 11 U/L (ref 6–29)
AST: 13 U/L (ref 10–30)
Albumin: 4.7 g/dL (ref 3.6–5.1)
Alkaline phosphatase (APISO): 44 U/L (ref 31–125)
BUN: 11 mg/dL (ref 7–25)
CO2: 23 mmol/L (ref 20–32)
Calcium: 9.2 mg/dL (ref 8.6–10.2)
Chloride: 108 mmol/L (ref 98–110)
Creat: 0.76 mg/dL (ref 0.50–1.10)
GFR, Est African American: 118 mL/min/{1.73_m2} (ref >=60)
GFR, Est Non African American: 102 mL/min/{1.73_m2} (ref >=60)
Globulin: 2.7 g/dL (calc) (ref 1.9–3.7)
Glucose, Bld: 84 mg/dL (ref 65–99)
Potassium: 4.3 mmol/L (ref 3.5–5.3)
Sodium: 139 mmol/L (ref 135–146)
Total Bilirubin: 0.7 mg/dL (ref 0.2–1.2)
Total Protein: 7.4 g/dL (ref 6.1–8.1)

## 2018-08-27 LAB — TSH: TSH: 0.93 mIU/L

## 2018-08-27 LAB — IRON,?TOTAL/TOTAL IRON BINDING CAP
%SAT: 10 % (calc) — ABNORMAL LOW (ref 16–45)
Iron: 37 ug/dL — ABNORMAL LOW (ref 40–190)

## 2018-08-27 LAB — IRON, TOTAL/TOTAL IRON BINDING CAP: TIBC: 375 mcg/dL (calc) (ref 250–450)

## 2018-08-27 LAB — VITAMIN D 25 HYDROXY (VIT D DEFICIENCY, FRACTURES): Vit D, 25-Hydroxy: 43 ng/mL (ref 30–100)

## 2018-11-12 DIAGNOSIS — Z3042 Encounter for surveillance of injectable contraceptive: Secondary | ICD-10-CM | POA: Diagnosis not present

## 2019-01-13 ENCOUNTER — Ambulatory Visit (INDEPENDENT_AMBULATORY_CARE_PROVIDER_SITE_OTHER): Payer: BC Managed Care – PPO

## 2019-01-13 ENCOUNTER — Other Ambulatory Visit: Payer: Self-pay

## 2019-01-13 VITALS — Temp 97.4°F

## 2019-01-13 DIAGNOSIS — Z23 Encounter for immunization: Secondary | ICD-10-CM | POA: Diagnosis not present

## 2019-08-29 NOTE — Progress Notes (Signed)
Encounter for Complete Physical  Assessment and Plan:   Encounter for routine adult health examination without abnormal findings Given information about gardesil vaccine Health Maintenance- Discussed STD testing, safe sex, alcohol and drug awareness, drinking and driving dangers, wearing a seat belt and general safety measures for young adult.  Medication management -     CBC with Differential/Platelet -     CMP/GFR  Mild hyperlipidemia Continue low cholesterol diet and exercise.  Check lipid panel.  -     Lipid panel  Anemia        iron deficiency anemia following pregnancy and c-sec; recheck for resolution        -     CBC    Screening for hematuria or proteinuria -     Urinalysis w microscopic + reflex cultur  Screening for thyroid disorder -     TSH  Vitamin D deficiency -     VITAMIN D 25 Hydroxy (Vit-D Deficiency, Fractures)  Enlarged thyroid Check TSH, TPO, thyroid ultrasound No family history, denies concerning sx  Discussed med's effects and SE's. Screening labs and tests as requested with regular follow-up as recommended. Over 40 minutes of exam, counseling, chart review, and complex, high level critical decision making was performed this visit.   Future Appointments  Date Time Provider Department Center  08/31/2020 10:00 AM Judd Gaudier, NP [facility] None     HPI  34 y.o. female, presents for a complete physical. She has Hyperlipidemia, mild; Iron deficiency anemia; and Vitamin D deficiency on their problem list.   She has no complaints today.   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 Lots of stress r/t working in school during pandemic, much better now that on summer vacation.   She follows with OBGYN for feminine concerns, last PAP 06/2019. Hx of hydronephrosis without complication or intervention during her first pregnancy. She is on depo shots via GYN, plans to transition off to pill, gaining  weight on shots.   BMI is Body mass index is 25.5 kg/m., she has been working on diet and exercise.  She 5 glasses of water daily.  She sees a chiropractor occasionally, and does yoga, uses apple watch to track, 30 min daily of activity Drinks alcohol once weekly socially.  Wt Readings from Last 3 Encounters:  09/01/19 170 lb 3.2 oz (77.2 kg)  08/26/18 154 lb (69.9 kg)  03/30/18 178 lb 12.8 oz (81.1 kg)   Today their BP is BP: 110/66 She does workout. She denies chest pain, shortness of breath, dizziness.    She is not on cholesterol medication and denies myalgias. Her cholesterol is not at goal. The cholesterol last visit was:   Lab Results  Component Value Date   CHOL 177 08/26/2018   HDL 49 (L) 08/26/2018   LDLCALC 113 (H) 08/26/2018   TRIG 67 08/26/2018   CHOLHDL 3.6 08/26/2018    She has been working on diet and exercise for glucose management, and denies increased appetite, nausea, paresthesia of the feet, polydipsia, polyuria and visual disturbances. Last A1C in the office was:  Lab Results  Component Value Date   HGBA1C 5.0 05/29/2017   Patient is not currently on Vitamin D supplement.   Lab Results  Component Value Date   VD25OH 43 08/26/2018      Anemic following pregnancy and c section, completed 1 month of iron supplement via GYN.  Lab Results  Component Value Date   WBC  5.2 08/26/2018   HGB 12.6 08/26/2018   HCT 37.7 08/26/2018   MCV 81.1 08/26/2018   PLT 241 08/26/2018   Lab Results  Component Value Date   IRON 37 (L) 08/26/2018   TIBC 375 08/26/2018      Current Medications:  Current Outpatient Medications on File Prior to Visit  Medication Sig Dispense Refill   Multiple Vitamin (MULTIVITAMIN ADULT PO) Take by mouth daily.     Prenatal Vit-Fe Fumarate-FA (PRENATAL MULTIVITAMIN) TABS tablet Take 1 tablet by mouth daily at 12 noon.     No current facility-administered medications on file prior to visit.   Allergies:  No Known  Allergies Medical History:  She has Hyperlipidemia, mild; Iron deficiency anemia; and Vitamin D deficiency on their problem list. Health Maintenance:   Immunization History  Administered Date(s) Administered   Influenza Inj Mdck Quad With Preservative 01/13/2019   Influenza-Unspecified 12/14/2017   Tdap 11/15/2016, 01/24/2018    Tetanus: 01/2018 Flu vaccine: 12/2018 HPV: 0/3, will check with insurance   LMP: No LMP recorded. (Menstrual status: Other). Pap: 06/2019 - by GYN MGM: -   Colonoscopy: -  EGD: -  Last Dental Exam: Dr. Marland Kitchen 06/2019, sees q6 month Last Eye Exam: 2015 - no correction needed  Patient Care Team: [name], MD as PCP - General (Internal Medicine)  Surgical History:  She has a past surgical history that includes Abdominal surgery; Anterior cruciate ligament repair (Left, 02/2015); Cesarean section (N/A, 11/13/2016); and Cesarean section (N/A, 03/30/2018). Family History:  Herfamily history includes Alcohol abuse in her father and paternal grandfather; Alzheimer's disease in her paternal grandmother; Autoimmune disease in her paternal aunt; Breast cancer in her maternal grandmother and mother; Pancreatic cancer in her maternal uncle. Social History:  She reports that she has never smoked. She has never used smokeless tobacco. She reports current alcohol use of about 2.0 standard drinks of alcohol per week. She reports that she does not use drugs.  Review of Systems: Review of Systems  Constitutional: Negative for malaise/fatigue and weight loss.  HENT: Negative for hearing loss and tinnitus.   Eyes: Negative for blurred vision and double vision.  Respiratory: Negative for cough, sputum production, shortness of breath and wheezing.   Cardiovascular: Negative for chest pain, palpitations, orthopnea, claudication, leg swelling and PND.  Gastrointestinal: Negative for abdominal pain, blood in stool, constipation, diarrhea, heartburn, melena, nausea and  vomiting.  Genitourinary: Negative.   Musculoskeletal: Negative for joint pain and myalgias.  Skin: Negative for rash.  Neurological: Negative for dizziness, tingling, sensory change, weakness and headaches.  Endo/Heme/Allergies: Negative for polydipsia.  Psychiatric/Behavioral: Negative.  Negative for depression, memory loss, substance abuse and suicidal ideas. The patient is not nervous/anxious and does not have insomnia.   All other systems reviewed and are negative.   Physical Exam: Estimated body mass index is 25.5 kg/m as calculated from the following:   Height as of this encounter: 5' 8.5" (1.74 m).   Weight as of this encounter: 170 lb 3.2 oz (77.2 kg). BP 110/66    Pulse 81    Temp (!) 97.3 F (36.3 C)    Ht 5' 8.5" (1.74 m)    Wt 170 lb 3.2 oz (77.2 kg)    SpO2 98%    BMI 25.50 kg/m  General Appearance: Well nourished, in no apparent distress.  Eyes: PERRLA, EOMs, conjunctiva no swelling or erythema, normal fundi and vessels.  Sinuses: No Frontal/maxillary tenderness  ENT/Mouth: Ext aud canals clear, normal light reflex with TMs  without erythema, bulging. Good dentition. No erythema, swelling, or exudate on post pharynx. Tonsils not swollen or erythematous. Hearing normal.  Neck: Supple, diffusely mildly enlarged thyroid without palpable nodules, tenderness Respiratory: Respiratory effort normal, BS equal bilaterally without rales, rhonchi, wheezing or stridor.  Cardio: RRR without murmurs, rubs or gallops. Brisk peripheral pulses without edema.  Chest: symmetric, with normal excursions and percussion.  Breasts: Defer  Abdomen: Soft, nontender, no guarding, rebound, hernias, masses, or organomegaly.  Lymphatics: Non tender without lymphadenopathy.  Genitourinary: Defer Musculoskeletal: Full ROM all peripheral extremities,5/5 strength, and normal gait.  Skin: Warm, dry without rashes, lesions, ecchymosis. She has well healed surgical scar suprapubic, transverse without  erythema or tenderness.  Neuro: Cranial nerves intact, reflexes equal bilaterally. Normal muscle tone, no cerebellar symptoms. Sensation intact.  Psych: Awake and oriented X 3, normal affect, Insight and Judgment appropriate.   EKG: WNL, V1 RSR' for baseline in chart reviewed; defer   Debra [name] 10:28 AM [facility]

## 2019-09-01 ENCOUNTER — Encounter: Payer: Self-pay | Admitting: Adult Health

## 2019-09-01 ENCOUNTER — Other Ambulatory Visit: Payer: Self-pay

## 2019-09-01 ENCOUNTER — Ambulatory Visit (INDEPENDENT_AMBULATORY_CARE_PROVIDER_SITE_OTHER): Payer: 59 | Admitting: Adult Health

## 2019-09-01 VITALS — BP 110/66 | HR 81 | Temp 97.3°F | Ht 68.5 in | Wt 170.2 lb

## 2019-09-01 DIAGNOSIS — Z79899 Other long term (current) drug therapy: Secondary | ICD-10-CM

## 2019-09-01 DIAGNOSIS — Z1389 Encounter for screening for other disorder: Secondary | ICD-10-CM

## 2019-09-01 DIAGNOSIS — Z1329 Encounter for screening for other suspected endocrine disorder: Secondary | ICD-10-CM

## 2019-09-01 DIAGNOSIS — Z0000 Encounter for general adult medical examination without abnormal findings: Secondary | ICD-10-CM

## 2019-09-01 DIAGNOSIS — E785 Hyperlipidemia, unspecified: Secondary | ICD-10-CM

## 2019-09-01 DIAGNOSIS — E049 Nontoxic goiter, unspecified: Secondary | ICD-10-CM | POA: Insufficient documentation

## 2019-09-01 DIAGNOSIS — E559 Vitamin D deficiency, unspecified: Secondary | ICD-10-CM

## 2019-09-01 DIAGNOSIS — E042 Nontoxic multinodular goiter: Secondary | ICD-10-CM | POA: Insufficient documentation

## 2019-09-01 DIAGNOSIS — E663 Overweight: Secondary | ICD-10-CM

## 2019-09-01 DIAGNOSIS — D509 Iron deficiency anemia, unspecified: Secondary | ICD-10-CM

## 2019-09-01 DIAGNOSIS — Z131 Encounter for screening for diabetes mellitus: Secondary | ICD-10-CM

## 2019-09-01 DIAGNOSIS — R030 Elevated blood-pressure reading, without diagnosis of hypertension: Secondary | ICD-10-CM

## 2019-09-01 NOTE — Patient Instructions (Addendum)
Debra [name] , Thank you for taking time to come for your Annual Wellness Visit. I appreciate your ongoing commitment to your health goals. Please review the following plan we discussed and let me know if I can assist you in the future.   These are the goals we discussed: Goals    . Exercise 150 min/wk Moderate Activity    . LDL CALC < 100       This is a list of the screening recommended for you and due dates:  Health Maintenance  Topic Date Due  .  Hepatitis C: One time screening is recommended by Center for Disease Control  (CDC) for  adults born from [birth year] through 1967.   Never done  . COVID-19 Vaccine (1) Never done  . Flu Shot  10/19/2019  . Pap Smear  12/19/2019  . Tetanus Vaccine  01/25/2028  . HIV Screening  Completed     Check with insurance about gardesil coverage    High-Fiber Diet Fiber, also called dietary fiber, is a type of carbohydrate that is found in fruits, vegetables, whole grains, and beans. A high-fiber diet can have many health benefits. Your health care provider may recommend a high-fiber diet to help:  Prevent constipation. Fiber can make your bowel movements more regular.  Lower your cholesterol.  Relieve the following conditions: ? Swelling of veins in the anus (hemorrhoids). ? Swelling and irritation (inflammation) of specific areas of the digestive tract (uncomplicated diverticulosis). ? A problem of the large intestine (colon) that sometimes causes pain and diarrhea (irritable bowel syndrome, IBS).  Prevent overeating as part of a weight-loss plan.  Prevent heart disease, type 2 diabetes, and certain cancers. What is my plan? The recommended daily fiber intake in grams (g) includes:  38 g for men age 40 or younger.  30 g for men over age 69.  25 g for women age 110 or younger.  21 g for women over age 51. You can get the recommended daily intake of dietary fiber by:  Eating a variety of fruits, vegetables, grains, and beans.  Taking  a fiber supplement, if it is not possible to get enough fiber through your diet. What do I need to know about a high-fiber diet?  It is better to get fiber through food sources rather than from fiber supplements. There is not a lot of research about how effective supplements are.  Always check the fiber content on the nutrition facts label of any prepackaged food. Look for foods that contain 5 g of fiber or more per serving.  Talk with a diet and nutrition specialist (dietitian) if you have questions about specific foods that are recommended or not recommended for your medical condition, especially if those foods are not listed below.  Gradually increase how much fiber you consume. If you increase your intake of dietary fiber too quickly, you may have bloating, cramping, or gas.  Drink plenty of water. Water helps you to digest fiber. What are tips for following this plan?  Eat a wide variety of high-fiber foods.  Make sure that half of the grains that you eat each day are whole grains.  Eat breads and cereals that are made with whole-grain flour instead of refined flour or white flour.  Eat brown rice, bulgur wheat, or millet instead of white rice.  Start the day with a breakfast that is high in fiber, such as a cereal that contains 5 g of fiber or more per serving.  Use beans in  place of meat in soups, salads, and pasta dishes.  Eat high-fiber snacks, such as berries, raw vegetables, nuts, and popcorn.  Choose whole fruits and vegetables instead of processed forms like juice or sauce. What foods can I eat?  Fruits Berries. Pears. Apples. Oranges. Avocado. Prunes and raisins. Dried figs. Vegetables Sweet potatoes. Spinach. Kale. Artichokes. Cabbage. Broccoli. Cauliflower. Green peas. Carrots. Squash. Grains Whole-grain breads. Multigrain cereal. Oats and oatmeal. Brown rice. Barley. Bulgur wheat. Millet. Quinoa. Bran muffins. Popcorn. Rye wafer crackers. Meats and other  proteins Navy, kidney, and pinto beans. Soybeans. Split peas. Lentils. Nuts and seeds. Dairy Fiber-fortified yogurt. Beverages Fiber-fortified soy milk. Fiber-fortified orange juice. Other foods Fiber bars. The items listed above may not be a complete list of recommended foods and beverages. Contact a dietitian for more options. What foods are not recommended? Fruits Fruit juice. Cooked, strained fruit. Vegetables Fried potatoes. Canned vegetables. Well-cooked vegetables. Grains White bread. Pasta made with refined flour. White rice. Meats and other proteins Fatty cuts of meat. Fried chicken or fried fish. Dairy Milk. Yogurt. Cream cheese. Sour cream. Fats and oils Butters. Beverages Soft drinks. Other foods Cakes and pastries. The items listed above may not be a complete list of foods and beverages to avoid. Contact a dietitian for more information. Summary  Fiber is a type of carbohydrate. It is found in fruits, vegetables, whole grains, and beans.  There are many health benefits of eating a high-fiber diet, such as preventing constipation, lowering blood cholesterol, helping with weight loss, and reducing your risk of heart disease, diabetes, and certain cancers.  Gradually increase your intake of fiber. Increasing too fast can result in cramping, bloating, and gas. Drink plenty of water while you increase your fiber.  The best sources of fiber include whole fruits and vegetables, whole grains, nuts, seeds, and beans. This information is not intended to replace advice given to you by your health care provider. Make sure you discuss any questions you have with your health care provider. Document Revised: 01/08/2017 Document Reviewed: 01/08/2017 Elsevier Patient Education  2020 [employer].      Goiter  A goiter is an enlarged thyroid gland. The thyroid is located in the lower front of the neck. It makes hormones that affect many body parts and systems, including the  system that affects how quickly the body burns fuel for energy (metabolism). Most goiters are painless and are not a cause for concern. Some goiters can affect the way your thyroid makes thyroid hormones. Goiters and conditions that cause goiters can be treated, if necessary. What are the causes? Common causes of this condition include:  Lack (deficiency) of a mineral called iodine. The thyroid gland uses iodine to make thyroid hormones.  Diseases that attack healthy cells in the body (autoimmune diseases) and affect thyroid function, such as Graves' disease or Hashimoto's disease. These diseases may cause the body to produce too much thyroid hormone (hyperthyroidism) or too little of the hormone (hypothyroidism).  Conditions that cause inflammation of the thyroid (thyroiditis).  One or more small growths on the thyroid (nodular goiter). Other causes include:  Medical problems caused by abnormal genes that are passed from parent to child (genetic defects).  Thyroid injury or infection.  Tumors that may or may not be cancerous.  Pregnancy.  Certain medicines.  Exposure to radiation. In some cases, the cause may not be known. What increases the risk? This condition is more likely to develop in:  People who do not get enough iodine in  their diet.  People who have a family history of goiter.  Women.  People who are older than age 81.  People who smoke tobacco.  People who have had exposure to radiation. What are the signs or symptoms? The main symptom of this condition is swelling in the lower, front part of the neck. This swelling can range from a very small bump to a large lump. Other symptoms may include:  A tight feeling in the throat.  A hoarse voice.  Coughing.  Wheezing.  Difficulty swallowing or breathing.  Bulging veins in the neck.  Dizziness. When a goiter is the result of an overactive thyroid (hyperthyroidism), symptoms may also include:  Nervousness  or restlessness.  Inability to tolerate heat.  Unexplained weight loss.  Diarrhea.  Change in the texture of hair or skin.  Changes in heartbeat, such as skipped beats, extra beats, or a rapid heart rate.  Loss of menstruation.  Shaky hands.  Increased appetite.  Sleep problems. When a goiter is the result of an underactive thyroid (hypothyroidism), symptoms may also include:  Feeling like you have no energy (lethargy).  Inability to tolerate cold.  Weight gain that is not explained by a change in diet or exercise habits.  Dry skin.  Coarse hair.  Irregular menstrual periods.  Constipation.  Sadness or depression.  Fatigue. In some cases, there may not be any symptoms and the thyroid hormone levels may be normal. How is this diagnosed? This condition may be diagnosed based on your symptoms, your medical history, and a physical exam. You may have tests, such as:  Blood tests to check thyroid function.  Imaging tests, such as: ? Ultrasound. ? CT scan. ? MRI. ? Thyroid scan.  Removal of a tissue sample (biopsy) of the goiter or any nodules. The sample will be tested to check for cancer. How is this treated? Treatment for this condition depends on the cause and your symptoms. Treatment may include:  Medicines to regulate thyroid hormone levels.  Anti-inflammatory medicines or steroid medicines, if the goiter is caused by inflammation.  Iodine supplements or changes to your diet, if the goiter is caused by iodine deficiency.  Radioactive iodine treatment.  Surgery to remove your thyroid. In some cases, you may only need regular check-ups with your health care provider to monitor your condition, and you may not need treatment. Follow these instructions at home:  Follow instructions from your health care provider about any changes to your diet.  Take over-the-counter and prescription medicines only as told by your health care provider. These include  supplements.  Do not use any products that contain nicotine or tobacco, such as cigarettes and e-cigarettes. If you need help quitting, ask your health care provider.  Keep all follow-up visits as told by your health care provider. This is important. Contact a health care provider if:  Your symptoms do not get better with treatment.  You have nausea, vomiting, or diarrhea. Get help right away if:  You have sudden, unexplained confusion or other mental changes.  You have a fever.  You have chest pain.  You have trouble breathing or swallowing.  You suddenly become very weak.  You experience extreme restlessness.  You feel your heart racing. Summary  A goiter is an enlarged thyroid gland.  The thyroid gland is located in the lower front of the neck. It makes hormones that affect many body parts and systems, including the system that affects how quickly the body burns fuel for energy (metabolism).  The main symptom of this condition is swelling in the lower, front part of the neck. This swelling can range from a very small bump to a large lump.  Treatment for this condition depends on the cause and your symptoms. You may need medicines, supplements, or regular monitoring of your condition. This information is not intended to replace advice given to you by your health care provider. Make sure you discuss any questions you have with your health care provider. Document Revised: 02/16/2017 Document Reviewed: 11/30/2016 Elsevier Patient Education  2020 [employer].

## 2019-09-02 LAB — CBC WITH DIFFERENTIAL/PLATELET
Absolute Monocytes: 312 cells/uL (ref 200–950)
Basophils Absolute: 29 cells/uL (ref 0–200)
Basophils Relative: 0.6 %
Eosinophils Absolute: 62 cells/uL (ref 15–500)
Eosinophils Relative: 1.3 %
HCT: 41.8 % (ref 35.0–45.0)
Hemoglobin: 13.7 g/dL (ref 11.7–15.5)
Lymphs Abs: 1699 cells/uL (ref 850–3900)
MCH: 27.8 pg (ref 27.0–33.0)
MCHC: 32.8 g/dL (ref 32.0–36.0)
MCV: 84.8 fL (ref 80.0–100.0)
MPV: 10.6 fL (ref 7.5–12.5)
Monocytes Relative: 6.5 %
Neutro Abs: 2698 cells/uL (ref 1500–7800)
Neutrophils Relative %: 56.2 %
Platelets: 228 10*3/uL (ref 140–400)
RBC: 4.93 10*6/uL (ref 3.80–5.10)
RDW: 12.8 % (ref 11.0–15.0)
Total Lymphocyte: 35.4 %
WBC: 4.8 10*3/uL (ref 3.8–10.8)

## 2019-09-02 LAB — COMPLETE METABOLIC PANEL WITH GFR
AG Ratio: 1.6 (calc) (ref 1.0–2.5)
ALT: 12 U/L (ref 6–29)
AST: 12 U/L (ref 10–30)
Albumin: 4.5 g/dL (ref 3.6–5.1)
Alkaline phosphatase (APISO): 45 U/L (ref 31–125)
BUN: 10 mg/dL (ref 7–25)
CO2: 27 mmol/L (ref 20–32)
Calcium: 9.5 mg/dL (ref 8.6–10.2)
Chloride: 106 mmol/L (ref 98–110)
Creat: 0.65 mg/dL (ref 0.50–1.10)
GFR, Est African American: 132 mL/min/{1.73_m2} (ref >=60)
GFR, Est Non African American: 114 mL/min/{1.73_m2} (ref >=60)
Globulin: 2.8 g/dL (calc) (ref 1.9–3.7)
Glucose, Bld: 84 mg/dL (ref 65–99)
Potassium: 4.2 mmol/L (ref 3.5–5.3)
Sodium: 140 mmol/L (ref 135–146)
Total Bilirubin: 1.2 mg/dL (ref 0.2–1.2)
Total Protein: 7.3 g/dL (ref 6.1–8.1)

## 2019-09-02 LAB — MAGNESIUM: Magnesium: 2.2 mg/dL (ref 1.5–2.5)

## 2019-09-02 LAB — URINALYSIS, ROUTINE W REFLEX MICROSCOPIC
Bilirubin Urine: NEGATIVE
Glucose, UA: NEGATIVE
Hgb urine dipstick: NEGATIVE
Ketones, ur: NEGATIVE
Leukocytes,Ua: NEGATIVE
Nitrite: NEGATIVE
Protein, ur: NEGATIVE
Specific Gravity, Urine: 1.016 (ref 1.001–1.03)
pH: 7.5 (ref 5.0–8.0)

## 2019-09-02 LAB — LIPID PANEL
Cholesterol: 170 mg/dL (ref <200)
HDL: 42 mg/dL — ABNORMAL LOW (ref >=50)
LDL Cholesterol (Calc): 102 mg/dL (calc) — ABNORMAL HIGH
Non-HDL Cholesterol (Calc): 128 mg/dL (calc) (ref <130)
Total CHOL/HDL Ratio: 4 (calc) (ref <5.0)
Triglycerides: 166 mg/dL — ABNORMAL HIGH (ref <150)

## 2019-09-02 LAB — HEMOGLOBIN A1C
Hgb A1c MFr Bld: 5.3 % of total Hgb (ref <5.7)
Mean Plasma Glucose: 105 (calc)
eAG (mmol/L): 5.8 (calc)

## 2019-09-02 LAB — TSH: TSH: 0.82 mIU/L

## 2019-09-02 LAB — THYROID PEROXIDASE ANTIBODIES (TPO) (REFL): Thyroperoxidase Ab SerPl-aCnc: <1 IU/mL (ref <9)

## 2019-09-02 LAB — VITAMIN D 25 HYDROXY (VIT D DEFICIENCY, FRACTURES): Vit D, 25-Hydroxy: 33 ng/mL (ref 30–100)

## 2019-09-11 ENCOUNTER — Ambulatory Visit
Admission: RE | Admit: 2019-09-11 | Discharge: 2019-09-11 | Disposition: A | Discharge status: Home / Self Care | Payer: BLUE CROSS/BLUE SHIELD | Source: Ambulatory Visit | Attending: Adult Health | Admitting: Adult Health

## 2019-09-11 DIAGNOSIS — E049 Nontoxic goiter, unspecified: Secondary | ICD-10-CM

## 2019-09-12 ENCOUNTER — Encounter: Payer: Self-pay | Admitting: Adult Health

## 2020-08-30 NOTE — Progress Notes (Deleted)
Encounter for Complete Physical  Assessment and Plan:   Encounter for routine adult health examination without abnormal findings Given information about gardesil vaccine Health Maintenance- Discussed STD testing, safe sex, alcohol and drug awareness, drinking and driving dangers, wearing a seat belt and general safety measures for young adult.  Medication management -     CBC with Differential/Platelet -     CMP/GFR  Mild hyperlipidemia Continue low cholesterol diet and exercise.  LDL goal <100 Check lipid panel.  -     Lipid panel  Anemia        iron deficiency anemia following pregnancy and c-sec; recheck for resolution        -     CBC    Screening for hematuria or proteinuria -     Urinalysis w microscopic + reflex cultur  Screening for thyroid disorder -     TSH  Vitamin D deficiency -     VITAMIN D 25 Hydroxy (Vit-D Deficiency, Fractures)  Multinodular thyroid Needs annual [place] x 5 years starting 2021 - TSH - Thyroid US  Discussed med's effects and SE's. Screening labs and tests as requested with regular follow-up as recommended. Over 40 minutes of exam, counseling, chart review, and complex, high level critical decision making was performed this visit.   Future Appointments  Date Time Provider Department Center  08/31/2020 10:00 AM Judd Gaudier, NP [facility] None     HPI  35 y.o. female, presents for a complete physical. She has Hyperlipidemia, mild; Vitamin D deficiency; and Multiple thyroid nodules on their problem list.   She has no complaints today.   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 Lots of stress r/t working in school during pandemic, much better now that on summer vacation.   She follows with OBGYN for feminine concerns, last PAP 06/2019. Hx of hydronephrosis without complication or intervention during her first pregnancy. She is on depo shots via GYN, plans to transition off to pill,  gaining weight on shots.   BMI is There is no height or weight on file to calculate BMI., she has been working on diet and exercise.  She 5 glasses of water daily.  She sees a chiropractor occasionally, and does yoga, uses apple watch to track, 30 min daily of activity Drinks alcohol once weekly socially.  Wt Readings from Last 3 Encounters:  09/01/19 170 lb 3.2 oz (77.2 kg)  08/26/18 154 lb (69.9 kg)  03/30/18 178 lb 12.8 oz (81.1 kg)   Today their BP is   She does workout. She denies chest pain, shortness of breath, dizziness.    She is not on cholesterol medication and denies myalgias. Her cholesterol is not at goal. The cholesterol last visit was:   Lab Results  Component Value Date   CHOL 170 09/01/2019   HDL 42 (L) 09/01/2019   LDLCALC 102 (H) 09/01/2019   TRIG 166 (H) 09/01/2019   CHOLHDL 4.0 09/01/2019    She has been working on diet and exercise for glucose management, and denies increased appetite, nausea, paresthesia of the feet, polydipsia, polyuria and visual disturbances. Last A1C in the office was:  Lab Results  Component Value Date   HGBA1C 5.3 09/01/2019   Patient is not currently on Vitamin D supplement.   Lab Results  Component Value Date   VD25OH 33 09/01/2019     She had [place] 08/2019 showing multinodular thyroid meeting criteria for follow up. She had  normal TSH and TPO 09/01/2019.  Lab Results  Component Value Date   TSH 0.82 09/01/2019      Current Medications:  Current Outpatient Medications on File Prior to Visit  Medication Sig Dispense Refill   Multiple Vitamin (MULTIVITAMIN ADULT PO) Take by mouth daily.     Prenatal Vit-Fe Fumarate-FA (PRENATAL MULTIVITAMIN) TABS tablet Take 1 tablet by mouth daily at 12 noon.     No current facility-administered medications on file prior to visit.   Allergies:  No Known Allergies Medical History:  She has Hyperlipidemia, mild; Vitamin D deficiency; and Multiple thyroid nodules on their problem  list. Health Maintenance:   Immunization History  Administered Date(s) Administered   Influenza Inj Mdck Quad With Preservative 01/13/2019   Influenza-Unspecified 12/14/2017   PFIZER(Purple Top)SARS-COV-2 Vaccination 04/24/2019, 05/15/2019   Tdap 11/15/2016, 01/24/2018   *** Tetanus: 01/2018 Flu vaccine: 12/2018 HPV: 0/3, will check with insurance   LMP: No LMP recorded. (Menstrual status: Other). Pap: 06/2019 - by GYN MGM: -   Colonoscopy: -  EGD: -  Last Dental Exam: Dr. Marland Kitchen 06/2019, sees q6 month Last Eye Exam: 2015 - no correction needed  Patient Care Team: [name], MD as PCP - General (Internal Medicine)  Surgical History:  She has a past surgical history that includes Abdominal surgery; Anterior cruciate ligament repair (Left, 02/2015); Cesarean section (N/A, 11/13/2016); and Cesarean section (N/A, 03/30/2018). Family History:  Herfamily history includes Alcohol abuse in her father and paternal grandfather; Alzheimer's disease in her paternal grandmother; Autoimmune disease in her paternal aunt; Breast cancer in her maternal grandmother and mother; Pancreatic cancer in her maternal uncle. Social History:  She reports that she has never smoked. She has never used smokeless tobacco. She reports current alcohol use of about 3.0 standard drinks of alcohol per week. She reports that she does not use drugs.  Review of Systems: Review of Systems  Constitutional:  Negative for malaise/fatigue and weight loss.  HENT:  Negative for hearing loss and tinnitus.   Eyes:  Negative for blurred vision and double vision.  Respiratory:  Negative for cough, sputum production, shortness of breath and wheezing.   Cardiovascular:  Negative for chest pain, palpitations, orthopnea, claudication, leg swelling and PND.  Gastrointestinal:  Negative for abdominal pain, blood in stool, constipation, diarrhea, heartburn, melena, nausea and vomiting.  Genitourinary: Negative.   Musculoskeletal:   Negative for joint pain and myalgias.  Skin:  Negative for rash.  Neurological:  Negative for dizziness, tingling, sensory change, weakness and headaches.  Endo/Heme/Allergies:  Negative for polydipsia.  Psychiatric/Behavioral: Negative.  Negative for depression, memory loss, substance abuse and suicidal ideas. The patient is not nervous/anxious and does not have insomnia.   All other systems reviewed and are negative.  Physical Exam: Estimated body mass index is 25.5 kg/m as calculated from the following:   Height as of 09/01/19: 5' 8.5" (1.74 m).   Weight as of 09/01/19: 170 lb 3.2 oz (77.2 kg). There were no vitals taken for this visit. General Appearance: Well nourished, in no apparent distress.  Eyes: PERRLA, EOMs, conjunctiva no swelling or erythema, normal fundi and vessels.  Sinuses: No Frontal/maxillary tenderness  ENT/Mouth: Ext aud canals clear, normal light reflex with TMs without erythema, bulging. Good dentition. No erythema, swelling, or exudate on post pharynx. Tonsils not swollen or erythematous. Hearing normal.  Neck: Supple, diffusely mildly enlarged thyroid without palpable nodules, tenderness Respiratory: Respiratory effort normal, BS equal bilaterally without rales, rhonchi, wheezing or stridor.  Cardio: RRR without  murmurs, rubs or gallops. Brisk peripheral pulses without edema.  Chest: symmetric, with normal excursions and percussion.  Breasts: Defer  Abdomen: Soft, nontender, no guarding, rebound, hernias, masses, or organomegaly.  Lymphatics: Non tender without lymphadenopathy.  Genitourinary: Defer Musculoskeletal: Full ROM all peripheral extremities,5/5 strength, and normal gait.  Skin: Warm, dry without rashes, lesions, ecchymosis. She has well healed surgical scar suprapubic, transverse without erythema or tenderness.  Neuro: Cranial nerves intact, reflexes equal bilaterally. Normal muscle tone, no cerebellar symptoms. Sensation intact.  Psych: Awake and  oriented X 3, normal affect, Insight and Judgment appropriate.   EKG: WNL, V1 RSR' for baseline in chart reviewed; defer   Dan Maker 8:34 AM [facility]

## 2020-08-31 ENCOUNTER — Encounter: Payer: Self-pay | Admitting: Adult Health

## 2020-09-29 DIAGNOSIS — Z309 Encounter for contraceptive management, unspecified: Secondary | ICD-10-CM | POA: Insufficient documentation

## 2020-12-16 ENCOUNTER — Ambulatory Visit (INDEPENDENT_AMBULATORY_CARE_PROVIDER_SITE_OTHER): Payer: 59 | Admitting: Adult Health

## 2020-12-16 ENCOUNTER — Other Ambulatory Visit: Payer: Self-pay

## 2020-12-16 ENCOUNTER — Encounter: Payer: Self-pay | Admitting: Adult Health

## 2020-12-16 VITALS — BP 108/76 | HR 79 | Temp 97.2°F | Ht 68.5 in | Wt 164.4 lb

## 2020-12-16 DIAGNOSIS — Z13228 Encounter for screening for other metabolic disorders: Secondary | ICD-10-CM

## 2020-12-16 DIAGNOSIS — Z1389 Encounter for screening for other disorder: Secondary | ICD-10-CM

## 2020-12-16 DIAGNOSIS — E042 Nontoxic multinodular goiter: Secondary | ICD-10-CM

## 2020-12-16 DIAGNOSIS — Z0000 Encounter for general adult medical examination without abnormal findings: Secondary | ICD-10-CM | POA: Diagnosis not present

## 2020-12-16 DIAGNOSIS — E559 Vitamin D deficiency, unspecified: Secondary | ICD-10-CM

## 2020-12-16 DIAGNOSIS — Z130 Encounter for screening for diseases of the blood and blood-forming organs and certain disorders involving the immune mechanism: Secondary | ICD-10-CM

## 2020-12-16 DIAGNOSIS — Z0001 Encounter for general adult medical examination with abnormal findings: Secondary | ICD-10-CM

## 2020-12-16 DIAGNOSIS — E785 Hyperlipidemia, unspecified: Secondary | ICD-10-CM

## 2020-12-16 NOTE — Progress Notes (Signed)
Encounter for Complete Physical  Assessment and Plan:   Encounter for routine adult health examination without abnormal findings Given information about gardesil vaccine Health Maintenance- Discussed STD testing, safe sex, alcohol and drug awareness, drinking and driving dangers, wearing a seat belt and general safety measures for young adult.  Medication management -     CBC with Differential/Platelet -     CMP/GFR  Mild hyperlipidemia Continue low cholesterol diet and exercise.  Check lipid panel.  -     Lipid panel  Anemia screening         iron deficiency anemia following pregnancy and c-sec;        -     CBC    Screening for hematuria or proteinuria -     Urinalysis w microscopic + reflex cultur  Screening for thyroid disorder -     TSH  Vitamin D deficiency -     VITAMIN D 25 Hydroxy (Vit-D Deficiency, Fractures)  Multinodular thyroid Labs normal in 2021; met criteria for annual [place] follow up for 5 years from 2021 No family history, denies concerning sx - US thyroid   No orders of the defined types were placed in this encounter.   Discussed med's effects and SE's. Screening labs and tests as requested with regular follow-up as recommended. Over 40 minutes of exam, counseling, chart review, and complex, high level critical decision making was performed this visit.   Future Appointments  Date Time Provider [place]  12/20/2021  3:00 PM [name], NP [facility] None     HPI  35 y.o. female, presents for a complete physical. Debra [name] has Hyperlipidemia, mild; Vitamin D deficiency; and Multiple thyroid nodules on their problem list.   Debra [name] has no complaints today.   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 Less stress this year.   Debra [name] follows with OBGYN (Dr. Pamala Hurry) for feminine concerns, last PAP 06/2019. Hx of hydronephrosis without complication or intervention during her first pregnancy. Debra [name] is newly  on xulane patch.   BMI is Body mass index is 24.63 kg/m., Debra [name] has been working on diet and exercise. Has started noom, tracking and awareness.  Debra [name] 5 glasses of water daily.   Debra [name] sees a chiropractor occasionally, and does yoga, uses apple watch to track, 30 min daily of activity Drinks alcohol once weekly socially.  Wt Readings from Last 3 Encounters:  12/16/20 164 lb 6.4 oz (74.6 kg)  09/01/19 170 lb 3.2 oz (77.2 kg)  08/26/18 154 lb (69.9 kg)   Today their BP is BP: 108/76 Debra [name] does workout. Debra [name] denies chest pain, shortness of breath, dizziness.    Debra [name] is not on cholesterol medication and denies myalgias. Her cholesterol is not at goal. The cholesterol last visit was:   Lab Results  Component Value Date   CHOL 170 09/01/2019   HDL 42 (L) 09/01/2019   LDLCALC 102 (H) 09/01/2019   TRIG 166 (H) 09/01/2019   CHOLHDL 4.0 09/01/2019    Debra [name] has been working on diet and exercise for glucose management, and denies increased appetite, nausea, paresthesia of the feet, polydipsia, polyuria and visual disturbances. Last A1C in the office was:  Lab Results  Component Value Date   HGBA1C 5.3 09/01/2019   Patient is not currently on Vitamin D supplement.   Lab Results  Component Value Date   VD25OH 33 09/01/2019     09/01/19 felt nnlarged thyroid on exam, had Normal TSH, TPO.  Thyroid US 6/24/2021showed 2 L nodules meeting criteria for annual [place] surveillance for 5 years.  Lab Results  Component Value Date   TSH 0.82 09/01/2019      Current Medications:  Current Outpatient Medications on File Prior to Visit  Medication Sig Dispense Refill   Multiple Vitamin (MULTIVITAMIN ADULT PO) Take by mouth daily.     norelgestromin-ethinyl estradiol Marilu Favre) 150-35 MCG/24HR transdermal patch Place 1 patch onto the skin once a week.     Prenatal Vit-Fe Fumarate-FA (PRENATAL MULTIVITAMIN) TABS tablet Take 1 tablet by mouth daily at 12 noon.     No current facility-administered medications on file  prior to visit.   Allergies:  No Known Allergies Medical History:  Debra [name] has Hyperlipidemia, mild; Vitamin D deficiency; and Multiple thyroid nodules on their problem list. Health Maintenance:   Immunization History  Administered Date(s) Administered   Influenza Inj Mdck Quad With Preservative 01/13/2019   Influenza-Unspecified 12/14/2017   PFIZER(Purple Top)SARS-COV-2 Vaccination 04/24/2019, 05/15/2019   Tdap 11/15/2016, 01/24/2018    Tetanus: 01/2018 Flu vaccine: 12/2018, plans in 12/2020 HPV: 0/3, will check with insurance  Covid 19:   LMP: No LMP recorded. (Menstrual status: Other). Pap: PAP 2021, pelvic 09/2020 - by GYN, goes annually  MGM: -   Colonoscopy: -  EGD: -  Last Dental Exam: Dr. Marland Kitchen 2022, sees q6 month Last Eye Exam: 2015 - no correction needed  Patient Care Team: Unk Pinto, MD as PCP - General (Internal Medicine)  Surgical History:  Debra [name] has a past surgical history that includes Abdominal surgery; Anterior cruciate ligament repair (Left, 02/2015); Cesarean section (N/A, 11/13/2016); and Cesarean section (N/A, 03/30/2018). Family History:  Herfamily history includes Alcohol abuse in her father and paternal grandfather; Alzheimer's disease in her paternal grandmother; Autoimmune disease in her paternal aunt; Breast cancer in her maternal grandmother and mother; Pancreatic cancer in her maternal uncle. Social History:  Debra [name] reports that Debra [name] has never smoked. Debra [name] has never used smokeless tobacco. Debra [name] reports current alcohol use of about 3.0 standard drinks per week. Debra [name] reports that Debra [name] does not use drugs.  Review of Systems: Review of Systems  Constitutional:  Negative for malaise/fatigue and weight loss.  HENT:  Negative for hearing loss and tinnitus.   Eyes:  Negative for blurred vision and double vision.  Respiratory:  Negative for cough, sputum production, shortness of breath and wheezing.   Cardiovascular:  Negative for chest pain, palpitations,  orthopnea, claudication, leg swelling and PND.  Gastrointestinal:  Negative for abdominal pain, blood in stool, constipation, diarrhea, heartburn, melena, nausea and vomiting.  Genitourinary: Negative.   Musculoskeletal:  Negative for joint pain and myalgias.  Skin:  Negative for rash.  Neurological:  Negative for dizziness, tingling, sensory change, weakness and headaches.  Endo/Heme/Allergies:  Negative for polydipsia.  Psychiatric/Behavioral: Negative.  Negative for depression, memory loss, substance abuse and suicidal ideas. The patient is not nervous/anxious and does not have insomnia.   All other systems reviewed and are negative.  Physical Exam: Estimated body mass index is 24.63 kg/m as calculated from the following:   Height as of this encounter: 5' 8.5" (1.74 m).   Weight as of this encounter: 164 lb 6.4 oz (74.6 kg). BP 108/76   Pulse 79   Temp (!) 97.2 F (36.2 C)   Ht 5' 8.5" (1.74 m)   Wt 164 lb 6.4 oz (74.6 kg)   SpO2 99%   BMI 24.63 kg/m  General Appearance: Well nourished, in no apparent distress.  Eyes: PERRLA,  EOMs, conjunctiva no swelling or erythema Sinuses: No Frontal/maxillary tenderness  ENT/Mouth: Ext aud canals clear, normal light reflex with TMs without erythema, bulging. Good dentition. No erythema, swelling, or exudate on post pharynx. Tonsils not swollen or erythematous. Hearing normal.  Neck: Supple, diffusely mildly enlarged thyroid without palpable nodules, tenderness Respiratory: Respiratory effort normal, BS equal bilaterally without rales, rhonchi, wheezing or stridor.  Cardio: RRR without murmurs, rubs or gallops. Brisk peripheral pulses without edema.  Chest: symmetric, with normal excursions and percussion.  Breasts: Defer to GYN Abdomen: Soft, nontender, no guarding, rebound, hernias, masses, or organomegaly.  Lymphatics: Non tender without lymphadenopathy.  Genitourinary: Defer to GYN Musculoskeletal: Full ROM all peripheral extremities,5/5  strength, and normal gait.  Skin: Warm, dry without rashes, lesions, ecchymosis. Debra [name] has well healed surgical scar suprapubic, transverse without erythema or tenderness.  Neuro: Cranial nerves intact, reflexes equal bilaterally. Normal muscle tone, no cerebellar symptoms. Sensation intact.  Psych: Awake and oriented X 3, normal affect, Insight and Judgment appropriate.   EKG: WNL, V1 RSR' for baseline in chart reviewed; defer   Izora Ribas 3:25 PM [facility]

## 2020-12-16 NOTE — Patient Instructions (Addendum)
Debra [name] , Thank you for taking time to come for your Annual Wellness Visit. I appreciate your ongoing commitment to your health goals. Please review the following plan we discussed and let me know if I can assist you in the future.   These are the goals we discussed:  Goals      Exercise 150 min/wk Moderate Activity     LDL CALC < 100        This is a list of the screening recommended for you and due dates:  Health Maintenance  Topic Date Due   Pap Smear  12/19/2019   Flu Shot  10/18/2020   COVID-19 Vaccine (5 - Booster for Pfizer series) 03/25/2021   Tetanus Vaccine  01/25/2028   HIV Screening  Completed   HPV Vaccine  Aged Out   Hepatitis C Screening: USPSTF Recommendation to screen - Ages 18-79 yo.  Discontinued      YOU CAN CALL TO MAKE AN ULTRASOUND..  I have put in an order for an ultrasound for you to have You can set them up at your convenience by calling this number [phone] You will likely have the ultrasound at [address]  If you have any issues call our office and we will set this up for you.      Know what a healthy weight is for you (roughly BMI <25) and aim to maintain this  Aim for 7+ servings of fruits and vegetables daily  65-80+ fluid ounces of water or unsweet tea for healthy kidneys  Limit to max 1 drink of alcohol per day; avoid smoking/tobacco  Limit animal fats in diet for cholesterol and heart health - choose grass fed whenever available  Avoid highly processed foods, and foods high in saturated/trans fats  Aim for low stress - take time to unwind and care for your mental health  Aim for 150 min of moderate intensity exercise weekly for heart health, and weights twice weekly for bone health  Aim for 7-9 hours of sleep daily     High-Fiber Eating Plan Fiber, also called dietary fiber, is a type of carbohydrate. It is found foods such as fruits, vegetables, whole grains, and beans. A high-fiber diet can have  many health benefits. Your health care provider may recommend a high-fiber diet to help: Prevent constipation. Fiber can make your bowel movements more regular. Lower your cholesterol. Relieve the following conditions: Inflammation of veins in the anus (hemorrhoids). Inflammation of specific areas of the digestive tract (uncomplicated diverticulosis). A problem of the large intestine, also called the colon, that sometimes causes pain and diarrhea (irritable bowel syndrome, or IBS). Prevent overeating as part of a weight-loss plan. Prevent heart disease, type 2 diabetes, and certain cancers. What are tips for following this plan? Reading food labels  Check the nutrition facts label on food products for the amount of dietary fiber. Choose foods that have 5 grams of fiber or more per serving. The goals for recommended daily fiber intake include: Men (age 18 or younger): 34-38 g. Men (over age 9): 28-34 g. Women (age 57 or younger): 25-28 g. Women (over age 40): 22-25 g. Your daily fiber goal is _____________ g. Shopping Choose whole fruits and vegetables instead of processed forms, such as apple juice or applesauce. Choose a wide variety of high-fiber foods such as avocados, lentils, oats, and kidney beans. Read the nutrition facts label of the foods you choose. Be aware of foods with added fiber.  These foods often have high sugar and sodium amounts per serving. Cooking Use whole-grain flour for baking and cooking. Cook with brown rice instead of white rice. Meal planning Start the day with a breakfast that is high in fiber, such as a cereal that contains 5 g of fiber or more per serving. Eat breads and cereals that are made with whole-grain flour instead of refined flour or white flour. Eat brown rice, bulgur wheat, or millet instead of white rice. Use beans in place of meat in soups, salads, and pasta dishes. Be sure that half of the grains you eat each day are whole grains. General  information You can get the recommended daily intake of dietary fiber by: Eating a variety of fruits, vegetables, grains, nuts, and beans. Taking a fiber supplement if you are not able to take in enough fiber in your diet. It is better to get fiber through food than from a supplement. Gradually increase how much fiber you consume. If you increase your intake of dietary fiber too quickly, you may have bloating, cramping, or gas. Drink plenty of water to help you digest fiber. Choose high-fiber snacks, such as berries, raw vegetables, nuts, and popcorn. What foods should I eat? Fruits Berries. Pears. Apples. Oranges. Avocado. Prunes and raisins. Dried figs. Vegetables Sweet potatoes. Spinach. Kale. Artichokes. Cabbage. Broccoli. Cauliflower. Green peas. Carrots. Squash. Grains Whole-grain breads. Multigrain cereal. Oats and oatmeal. Brown rice. Barley. Bulgur wheat. Millet. Quinoa. Bran muffins. Popcorn. Rye wafer crackers. Meats and other proteins Navy beans, kidney beans, and pinto beans. Soybeans. Split peas. Lentils. Nuts and seeds. Dairy Fiber-fortified yogurt. Beverages Fiber-fortified soy milk. Fiber-fortified orange juice. Other foods Fiber bars. The items listed above may not be a complete list of recommended foods and beverages. Contact a dietitian for more information. What foods should I avoid? Fruits Fruit juice. Cooked, strained fruit. Vegetables Fried potatoes. Canned vegetables. Well-cooked vegetables. Grains White bread. Pasta made with refined flour. White rice. Meats and other proteins Fatty cuts of meat. Fried chicken or fried fish. Dairy Milk. Yogurt. Cream cheese. Sour cream. Fats and oils Butters. Beverages Soft drinks. Other foods Cakes and pastries. The items listed above may not be a complete list of foods and beverages to avoid. Talk with your dietitian about what choices are best for you. Summary Fiber is a type of carbohydrate. It is found in foods  such as fruits, vegetables, whole grains, and beans. A high-fiber diet has many benefits. It can help to prevent constipation, lower blood cholesterol, aid weight loss, and reduce your risk of heart disease, diabetes, and certain cancers. Increase your intake of fiber gradually. Increasing fiber too quickly may cause cramping, bloating, and gas. Drink plenty of water while you increase the amount of fiber you consume. The best sources of fiber include whole fruits and vegetables, whole grains, nuts, seeds, and beans. This information is not intended to replace advice given to you by your health care provider. Make sure you discuss any questions you have with your health care provider. Document Revised: 07/10/2019 Document Reviewed: 07/10/2019 Elsevier Patient Education  2022 [employer].

## 2020-12-17 LAB — COMPLETE METABOLIC PANEL WITH GFR
AG Ratio: 1.4 (calc) (ref 1.0–2.5)
ALT: 17 U/L (ref 6–29)
AST: 17 U/L (ref 10–30)
Albumin: 4.3 g/dL (ref 3.6–5.1)
Alkaline phosphatase (APISO): 54 U/L (ref 31–125)
BUN: 9 mg/dL (ref 7–25)
CO2: 27 mmol/L (ref 20–32)
Calcium: 9.3 mg/dL (ref 8.6–10.2)
Chloride: 103 mmol/L (ref 98–110)
Creat: 0.61 mg/dL (ref 0.50–0.97)
Globulin: 3 g/dL (calc) (ref 1.9–3.7)
Glucose, Bld: 79 mg/dL (ref 65–99)
Potassium: 4 mmol/L (ref 3.5–5.3)
Sodium: 138 mmol/L (ref 135–146)
Total Bilirubin: 0.7 mg/dL (ref 0.2–1.2)
Total Protein: 7.3 g/dL (ref 6.1–8.1)
eGFR: 118 mL/min/{1.73_m2} (ref >=60)

## 2020-12-17 LAB — LIPID PANEL
Cholesterol: 207 mg/dL — ABNORMAL HIGH (ref <200)
HDL: 61 mg/dL (ref >=50)
LDL Cholesterol (Calc): 120 mg/dL (calc) — ABNORMAL HIGH
Non-HDL Cholesterol (Calc): 146 mg/dL (calc) — ABNORMAL HIGH (ref <130)
Total CHOL/HDL Ratio: 3.4 (calc) (ref <5.0)
Triglycerides: 148 mg/dL (ref <150)

## 2020-12-17 LAB — URINALYSIS, ROUTINE W REFLEX MICROSCOPIC
Bacteria, UA: NONE SEEN /HPF
Bilirubin Urine: NEGATIVE
Glucose, UA: NEGATIVE
Hgb urine dipstick: NEGATIVE
Hyaline Cast: NONE SEEN /LPF
Nitrite: NEGATIVE
Protein, ur: NEGATIVE
RBC / HPF: NONE SEEN /HPF (ref 0–2)
Specific Gravity, Urine: 1.024 (ref 1.001–1.035)
Squamous Epithelial / HPF: NONE SEEN /HPF (ref <=5)
pH: 6.5 (ref 5.0–8.0)

## 2020-12-17 LAB — CBC WITH DIFFERENTIAL/PLATELET
Absolute Monocytes: 420 cells/uL (ref 200–950)
Basophils Absolute: 48 cells/uL (ref 0–200)
Basophils Relative: 0.8 %
Eosinophils Absolute: 72 cells/uL (ref 15–500)
Eosinophils Relative: 1.2 %
HCT: 39.1 % (ref 35.0–45.0)
Hemoglobin: 13.2 g/dL (ref 11.7–15.5)
Lymphs Abs: 1794 cells/uL (ref 850–3900)
MCH: 29.1 pg (ref 27.0–33.0)
MCHC: 33.8 g/dL (ref 32.0–36.0)
MCV: 86.1 fL (ref 80.0–100.0)
MPV: 10.7 fL (ref 7.5–12.5)
Monocytes Relative: 7 %
Neutro Abs: 3666 cells/uL (ref 1500–7800)
Neutrophils Relative %: 61.1 %
Platelets: 237 10*3/uL (ref 140–400)
RBC: 4.54 10*6/uL (ref 3.80–5.10)
RDW: 11.8 % (ref 11.0–15.0)
Total Lymphocyte: 29.9 %
WBC: 6 10*3/uL (ref 3.8–10.8)

## 2020-12-17 LAB — TSH: TSH: 0.82 mIU/L

## 2020-12-22 ENCOUNTER — Other Ambulatory Visit: Payer: 59

## 2020-12-27 ENCOUNTER — Ambulatory Visit
Admission: RE | Admit: 2020-12-27 | Discharge: 2020-12-27 | Disposition: A | Discharge status: Home / Self Care | Payer: 59 | Source: Ambulatory Visit | Attending: Adult Health | Admitting: Adult Health

## 2020-12-27 DIAGNOSIS — E042 Nontoxic multinodular goiter: Secondary | ICD-10-CM

## 2021-01-20 ENCOUNTER — Ambulatory Visit (INDEPENDENT_AMBULATORY_CARE_PROVIDER_SITE_OTHER): Payer: 59 | Admitting: Internal Medicine

## 2021-01-20 ENCOUNTER — Other Ambulatory Visit: Payer: Self-pay

## 2021-01-20 VITALS — BP 112/70 | HR 96 | Temp 97.9°F | Resp 17 | Ht 68.5 in | Wt 160.0 lb

## 2021-01-20 DIAGNOSIS — J0100 Acute maxillary sinusitis, unspecified: Secondary | ICD-10-CM | POA: Diagnosis not present

## 2021-01-20 DIAGNOSIS — J0410 Acute tracheitis without obstruction: Secondary | ICD-10-CM

## 2021-01-20 MED ORDER — AZITHROMYCIN 250 MG PO TABS: ORAL_TABLET | ORAL | 1 refills | Status: DC

## 2021-01-20 MED ORDER — DEXAMETHASONE 4 MG PO TABS: ORAL_TABLET | ORAL | 0 refills | Status: DC

## 2021-01-20 NOTE — Progress Notes (Signed)
    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    Medications    norelgestromin-ethinyl estradiol Burr Medico) 150-35 MCG/24HR transdermal patch, Place 1 patch onto the skin once a week.    Multiple Vitamin , Take daily.  Problem list She has Hyperlipidemia, mild; Vitamin D deficiency; and Multiple thyroid nodules on their problem list.   Observations/Objective:  BP 112/70   Pulse 96   Temp 97.9 F (36.6 C)   Resp 17   Ht 5' 8.5" (1.74 m)   Wt 160 lb (72.6 kg)   SpO2 97%   BMI 23.97 kg/m   Congested cough . No stridor. No rash, cyanosis.   HEENT - EACs cleat . TMs sl retracted. (+) Maxillary tenderness. N/O/P clear.  Neck - supple. No sig LN. Chest -  Scattered rales  & few rhonchi clear with cough. No Wheezes.  Cor - Nl HS. RRR w/o sig MGR. PP 1(+). No edema. MS- FROM w/o deformities.  Gait Nl. Neuro -  Nl w/o focal abnormalities.  Assessment and Plan:  1. Acute non-recurrent maxillary sinusitis  2. Tracheitis  - dexamethasone 4 MG tablet;  Take 1 tab 3 x day - 3 days, then 2 x day - 3 days, then 1 tab daily   Dispense: 20 tablet  - azithromycin 250 MG tablet;  Take 2 tablets with Food on  Day 1, then 1 tablet Daily  Dispense: 6 each; Refill: 1   Follow Up Instructions:      I discussed the assessment and treatment plan with the patient. The patient was provided an opportunity to ask questions and all were answered. The patient agreed with the plan and demonstrated an understanding of the instructions.       The patient was advised to call back or seek an in-person evaluation if the  symptoms worsen or if the condition fails to improve as anticipated.    Marinus Maw, MD

## 2021-01-22 ENCOUNTER — Encounter: Payer: Self-pay | Admitting: Internal Medicine

## 2021-09-21 ENCOUNTER — Encounter: Payer: BC Managed Care – PPO | Admitting: Adult Health

## 2021-09-21 NOTE — Progress Notes (Deleted)
Encounter for Complete Physical  Assessment and Plan:   Encounter for routine adult health examination without abnormal findings Given information about gardesil vaccine Health Maintenance- Discussed STD testing, safe sex, alcohol and drug awareness, drinking and driving dangers, wearing a seat belt and general safety measures for young adult.  Medication management -     CBC with Differential/Platelet -     CMP/GFR  Mild hyperlipidemia Continue low cholesterol diet and exercise.  Check lipid panel.  -     Lipid panel  Anemia screening         iron deficiency anemia following pregnancy and c-sec;        -     CBC ***    Screening for hematuria or proteinuria -     Urinalysis w microscopic + reflex cultur  Screening for thyroid disorder -     TSH  Vitamin D deficiency -     VITAMIN D 25 Hydroxy (Vit-D Deficiency, Fractures)  Multinodular thyroid Labs normal in 2021; met criteria for annual [place] follow up for 5 years from 2021 Follow up US 12/27/2020 showed resolved nodules *** No family history, denies concerning sx - US thyroid ***  No orders of the defined types were placed in this encounter.   Discussed med's effects and SE's. Screening labs and tests as requested with regular follow-up as recommended. Over 40 minutes of exam, counseling, chart review, and complex, high level critical decision making was performed this visit.   Future Appointments  Date Time Provider [place]  09/21/2021  3:00 PM [name], NP [facility] None  09/25/2022  3:00 PM Darrol Jump, NP [facility] None     HPI  36 y.o. female, presents for a complete physical. She has Hyperlipidemia, mild; Vitamin D deficiency; and Multiple thyroid nodules on their problem list.   She has no complaints today.   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 Less stress this year.   She follows with OBGYN (Dr. Pamala Hurry) for feminine concerns,  last PAP 06/2019. Hx of hydronephrosis without complication or intervention during her first pregnancy. She is newly on xulane patch.   BMI is There is no height or weight on file to calculate BMI., she has been working on diet and exercise. Has started noom, tracking and awareness.  She 5 glasses of water daily.  She sees a chiropractor occasionally, and does yoga, uses apple watch to track, 30 min daily of activity Drinks alcohol once weekly socially.  Wt Readings from Last 3 Encounters:  01/20/21 160 lb (72.6 kg)  12/16/20 164 lb 6.4 oz (74.6 kg)  09/01/19 170 lb 3.2 oz (77.2 kg)   Today their BP is   She does workout. She denies chest pain, shortness of breath, dizziness.    She is not on cholesterol medication and denies myalgias. Her cholesterol is not at goal. The cholesterol last visit was:   Lab Results  Component Value Date   CHOL 207 (H) 12/16/2020   HDL 61 12/16/2020   LDLCALC 120 (H) 12/16/2020   TRIG 148 12/16/2020   CHOLHDL 3.4 12/16/2020    She has been working on diet and exercise for glucose management, and denies increased appetite, nausea, paresthesia of the feet, polydipsia, polyuria and visual disturbances. Last A1C in the office was:  Lab Results  Component Value Date   HGBA1C 5.3 09/01/2019   Patient is not currently on Vitamin D supplement.   Lab Results  Component Value Date   VD25OH 33 09/01/2019     09/01/19 felt nnlarged thyroid on exam, had Normal TSH, TPO. Thyroid US 6/24/2021showed 2 L nodules meeting criteria for annual [place] surveillance for 5 years.  Follow up US 12/27/2020 showed no nodules/resolved; normal thyroid.  Lab Results  Component Value Date   TSH 0.82 12/16/2020     Current Medications:  Current Outpatient Medications on File Prior to Visit  Medication Sig Dispense Refill   azithromycin (ZITHROMAX) 250 MG tablet Take 2 tablets with Food on  Day 1, then 1 tablet Daily with Food for Sinusitis / Bronchitis 6 each 1   dexamethasone  (DECADRON) 4 MG tablet Take 1 tab 3 x day - 3 days, then 2 x day - 3 days, then 1 tab daily 20 tablet 0   Multiple Vitamin (MULTIVITAMIN ADULT PO) Take by mouth daily.     norelgestromin-ethinyl estradiol Marilu Favre) 150-35 MCG/24HR transdermal patch Place 1 patch onto the skin once a week.     No current facility-administered medications on file prior to visit.   Allergies:  No Known Allergies Medical History:  She has Hyperlipidemia, mild; Vitamin D deficiency; and Multiple thyroid nodules on their problem list. Health Maintenance:   Immunization History  Administered Date(s) Administered   Influenza Inj Mdck Quad With Preservative 01/13/2019   Influenza-Unspecified 12/14/2017   PFIZER(Purple Top)SARS-COV-2 Vaccination 04/24/2019, 05/15/2019, 12/14/2019, 11/23/2020   Tdap 11/15/2016, 01/24/2018   LMP: No LMP recorded. (Menstrual status: Other). Pap: PAP 2021, pelvic 09/2020 - by GYN, goes annually  MGM: -   Colonoscopy: plan age 22  Last Dental Exam: Dr. Marland Kitchen 2022, sees q6 month Last Eye Exam: 2015 - no correction needed  Patient Care Team: Unk Pinto, MD as PCP - General (Internal Medicine)  Surgical History:  She has a past surgical history that includes Abdominal surgery; Anterior cruciate ligament repair (Left, 02/2015); Cesarean section (N/A, 11/13/2016); and Cesarean section (N/A, 03/30/2018). Family History:  Herfamily history includes Alcohol abuse in her father and paternal grandfather; Alzheimer's disease in her paternal grandmother; Arthritis/Rheumatoid in her paternal aunt; Breast cancer in her maternal grandmother and mother; Pancreatic cancer in her maternal uncle. Social History:  She reports that she has never smoked. She has never used smokeless tobacco. She reports current alcohol use of about 3.0 standard drinks of alcohol per week. She reports that she does not use drugs.  Review of Systems: Review of Systems  Constitutional:  Negative for malaise/fatigue  and weight loss.  HENT:  Negative for hearing loss and tinnitus.   Eyes:  Negative for blurred vision and double vision.  Respiratory:  Negative for cough, sputum production, shortness of breath and wheezing.   Cardiovascular:  Negative for chest pain, palpitations, orthopnea, claudication, leg swelling and PND.  Gastrointestinal:  Negative for abdominal pain, blood in stool, constipation, diarrhea, heartburn, melena, nausea and vomiting.  Genitourinary: Negative.   Musculoskeletal:  Negative for joint pain and myalgias.  Skin:  Negative for rash.  Neurological:  Negative for dizziness, tingling, sensory change, weakness and headaches.  Endo/Heme/Allergies:  Negative for polydipsia.  Psychiatric/Behavioral: Negative.  Negative for depression, memory loss, substance abuse and suicidal ideas. The patient is not nervous/anxious and does not have insomnia.   All other systems reviewed and are negative.   Physical Exam: Estimated body mass index is 23.97 kg/m as calculated from the following:   Height as of 01/20/21: 5' 8.5" (1.74 m).   Weight as of 01/20/21: 160 lb (72.6 kg). There were  no vitals taken for this visit. General Appearance: Well nourished, in no apparent distress.  Eyes: PERRLA, EOMs, conjunctiva no swelling or erythema Sinuses: No Frontal/maxillary tenderness  ENT/Mouth: Ext aud canals clear, normal light reflex with TMs without erythema, bulging. Good dentition. No erythema, swelling, or exudate on post pharynx. Tonsils not swollen or erythematous. Hearing normal.  Neck: Supple, diffusely mildly enlarged thyroid without palpable nodules, tenderness Respiratory: Respiratory effort normal, BS equal bilaterally without rales, rhonchi, wheezing or stridor.  Cardio: RRR without murmurs, rubs or gallops. Brisk peripheral pulses without edema.  Chest: symmetric, with normal excursions and percussion.  Breasts: Defer to GYN Abdomen: Soft, nontender, no guarding, rebound, hernias,  masses, or organomegaly.  Lymphatics: Non tender without lymphadenopathy.  Genitourinary: Defer to GYN Musculoskeletal: Full ROM all peripheral extremities,5/5 strength, and normal gait.  Skin: Warm, dry without rashes, lesions, ecchymosis. She has well healed surgical scar suprapubic, transverse without erythema or tenderness.  Neuro: Cranial nerves intact, reflexes equal bilaterally. Normal muscle tone, no cerebellar symptoms. Sensation intact.  Psych: Awake and oriented X 3, normal affect, Insight and Judgment appropriate.   EKG: WNL, V1 RSR' for baseline in chart reviewed; defer  Izora Ribas, NP-C 10:19 AM [facility]

## 2021-09-22 ENCOUNTER — Encounter: Payer: Self-pay | Admitting: Nurse Practitioner

## 2021-09-22 ENCOUNTER — Ambulatory Visit (INDEPENDENT_AMBULATORY_CARE_PROVIDER_SITE_OTHER): Payer: BC Managed Care – PPO | Admitting: Nurse Practitioner

## 2021-09-22 VITALS — BP 104/68 | HR 85 | Temp 97.9°F | Resp 17 | Ht 68.5 in | Wt 164.6 lb

## 2021-09-22 DIAGNOSIS — Z0000 Encounter for general adult medical examination without abnormal findings: Secondary | ICD-10-CM

## 2021-09-22 DIAGNOSIS — E559 Vitamin D deficiency, unspecified: Secondary | ICD-10-CM

## 2021-09-22 DIAGNOSIS — F988 Other specified behavioral and emotional disorders with onset usually occurring in childhood and adolescence: Secondary | ICD-10-CM

## 2021-09-22 DIAGNOSIS — Z1389 Encounter for screening for other disorder: Secondary | ICD-10-CM

## 2021-09-22 DIAGNOSIS — Z0001 Encounter for general adult medical examination with abnormal findings: Secondary | ICD-10-CM

## 2021-09-22 DIAGNOSIS — Z13220 Encounter for screening for lipoid disorders: Secondary | ICD-10-CM | POA: Diagnosis not present

## 2021-09-22 DIAGNOSIS — Z79899 Other long term (current) drug therapy: Secondary | ICD-10-CM

## 2021-09-22 DIAGNOSIS — E785 Hyperlipidemia, unspecified: Secondary | ICD-10-CM

## 2021-09-22 DIAGNOSIS — E042 Nontoxic multinodular goiter: Secondary | ICD-10-CM

## 2021-09-22 MED ORDER — LISDEXAMFETAMINE DIMESYLATE 30 MG PO CAPS: 30.0000 mg | ORAL_CAPSULE | Freq: Every day | ORAL | 0 refills | Status: DC

## 2021-09-22 NOTE — Progress Notes (Signed)
Encounter for Complete Physical  Assessment and Plan:   1. Encounter for general adult medical examination with abnormal findings Due Annually   - CBC with Differential/Platelet - COMPLETE METABOLIC PANEL WITH GFR  2. Hyperlipidemia, mild Uncontrolled Discussed lifestyle modifications. Recommended diet heavy in fruits and veggies, omega 3's. Decrease consumption of animal meats, cheeses, and dairy products. Remain active and exercise as tolerated. Continue to monitor.  - Lipid panel  3. Vitamin D deficiency  - VITAMIN D 25 Hydroxy (Vit-D Deficiency, Fractures)  4. Multiple thyroid nodules Last [place] Clear Continue annual screening  - TSH  5. Screening for hematuria or proteinuria  - Urinalysis, Routine w reflex microscopic  6. Attention deficit disorder (ADD) in adult Adult ADHD Self-Report Scale (ASRS-v1.1) Symptom Checklist highly consistent with diagnosis. Discussed behavioral therapy. Exercise. Healthy diet. Sleep hygiene. Mindful meditation. Will consider stimulant tx. Start Vyvanse.  - lisdexamfetamine (VYVANSE) 30 MG capsule; Take 1 capsule (30 mg total) by mouth daily.  Dispense: 30 capsule; Refill: 0  7. Medication management All medications discussed and reviewed in full. All questions and concerns regarding medications addressed.    - CBC with Differential/Platelet - COMPLETE METABOLIC PANEL WITH GFR - Lipid panel - TSH - VITAMIN D 25 Hydroxy (Vit-D Deficiency, Fractures)  Discussed med's effects and SE's. Screening labs and tests as requested with regular follow-up as recommended. Over 40 minutes of exam, counseling, chart review, and complex, high level critical decision making was performed this visit.   Future Appointments  Date Time Provider Department Center  10/25/2021  4:00 PM [name], NP [facility] None  09/25/2022  3:00 PM [name], [name], NP [facility] None     HPI  36 y.o. female, presents for a complete physical. She has  Hyperlipidemia, mild; Vitamin D deficiency; and Multiple thyroid nodules on their problem list.   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 Less stress this year.   Patient c/o symptoms of inattention, impulsivity, and restlessness, resulting in functional impairment.  Reports symptoms since 28, more noticeable but also notes these symptoms as an adolescent/child.  No attention to details, difficulty sustaining attention, does not follow instructions, forgetful.  Often fidgets, unable to engage in leisure activities quietly, talks excessively, interrupts conversations.      She follows with OBGYN ([name]) for feminine concerns, last PAP completed in 2021.  Due 2024. Hx of hydronephrosis without complication or intervention during her first pregnancy. She is newly on xulane patch.   BMI is Body mass index is 24.66 kg/m., she has been working on diet and exercise. Has started noom, tracking and awareness.  She 5 glasses of water daily.   She sees a chiropractor occasionally, and does yoga, uses apple watch to track, 30 min daily of activity Drinks alcohol once weekly socially.  Wt Readings from Last 3 Encounters:  09/22/21 164 lb 9.6 oz (74.7 kg)  01/20/21 160 lb (72.6 kg)  12/16/20 164 lb 6.4 oz (74.6 kg)   Today their BP is BP: 104/68 She does workout. She denies chest pain, shortness of breath, dizziness.    She is not on cholesterol medication and denies myalgias. Her cholesterol is not at goal. The cholesterol last visit was:   Lab Results  Component Value Date   CHOL 207 (H) 12/16/2020   HDL 61 12/16/2020   LDLCALC 120 (H) 12/16/2020   TRIG 148 12/16/2020   CHOLHDL 3.4 12/16/2020    She has been working on  diet and exercise for glucose management, and denies increased appetite, nausea, paresthesia of the feet, polydipsia, polyuria and visual disturbances. Last A1C in the office was:  Lab Results  Component Value Date    HGBA1C 5.3 09/01/2019   Patient is not currently on Vitamin D supplement.   Lab Results  Component Value Date   VD25OH 33 09/01/2019     09/01/19 felt enlarged thyroid on exam, had Normal TSH, TPO. Thyroid US 6/24/2021showed 2 L nodules meeting criteria for annual [place] surveillance for 5 years. Last completed 12/2020. Previously seen left thyroid nodules are not identified on the current examination.  She will continue to follow with yearly screening for the next few years. Lab Results  Component Value Date   TSH 0.82 12/16/2020      Current Medications:  Current Outpatient Medications on File Prior to Visit  Medication Sig Dispense Refill   Multiple Vitamin (MULTIVITAMIN ADULT PO) Take by mouth daily.     norelgestromin-ethinyl estradiol Burr Medico) 150-35 MCG/24HR transdermal patch Place 1 patch onto the skin once a week.     azithromycin (ZITHROMAX) 250 MG tablet Take 2 tablets with Food on  Day 1, then 1 tablet Daily with Food for Sinusitis / Bronchitis (Patient not taking: Reported on 09/22/2021) 6 each 1   dexamethasone (DECADRON) 4 MG tablet Take 1 tab 3 x day - 3 days, then 2 x day - 3 days, then 1 tab daily (Patient not taking: Reported on 09/22/2021) 20 tablet 0   No current facility-administered medications on file prior to visit.   Allergies:  No Known Allergies Medical History:  She has Hyperlipidemia, mild; Vitamin D deficiency; and Multiple thyroid nodules on their problem list. Health Maintenance:   Immunization History  Administered Date(s) Administered   Influenza Inj Mdck Quad With Preservative 01/13/2019   Influenza-Unspecified 12/14/2017   PFIZER(Purple Top)SARS-COV-2 Vaccination 04/24/2019, 05/15/2019, 12/14/2019, 11/23/2020   Tdap 11/15/2016, 01/24/2018    Tetanus: 01/2018 Flu vaccine: 12/2020 HPV: 0/3, will check with insurance  Covid 19:   LMP: 09/19/2021 Continue BC Patch Pap: Due 2024 MGM: - not yet due  Colonoscopy: - not yet due  EGD: -  Last Dental  Exam: Dr. May 2023, sees q6 month Last Eye Exam: 2015 - no correction needed Due - she will reach out to to schedule  Patient Care Team: [name], MD as PCP - General (Internal Medicine)  Surgical History:  She has a past surgical history that includes Abdominal surgery; Anterior cruciate ligament repair (Left, 02/2015); Cesarean section (N/A, 11/13/2016); and Cesarean section (N/A, 03/30/2018). Family History:  Herfamily history includes Alcohol abuse in her father and paternal grandfather; Alzheimer's disease in her paternal grandmother; Arthritis/Rheumatoid in her paternal aunt; Breast cancer in her maternal grandmother and mother; Pancreatic cancer in her maternal uncle. Social History:  She reports that she has never smoked. She has never used smokeless tobacco. She reports current alcohol use of about 3.0 standard drinks of alcohol per week. She reports that she does not use drugs.  Review of Systems: Review of Systems  Constitutional:  Negative for malaise/fatigue and weight loss.  HENT:  Negative for hearing loss and tinnitus.   Eyes:  Negative for blurred vision and double vision.  Respiratory:  Negative for cough, sputum production, shortness of breath and wheezing.   Cardiovascular:  Negative for chest pain, palpitations, orthopnea, claudication, leg swelling and PND.  Gastrointestinal:  Negative for abdominal pain, blood in stool, constipation, diarrhea, heartburn, melena, nausea and vomiting.  Genitourinary:  Negative.   Musculoskeletal:  Negative for joint pain and myalgias.  Skin:  Negative for rash.  Neurological:  Negative for dizziness, tingling, sensory change, weakness and headaches.  Endo/Heme/Allergies:  Negative for polydipsia.  Psychiatric/Behavioral: Negative.  Negative for depression, memory loss, substance abuse and suicidal ideas. The patient is not nervous/anxious and does not have insomnia.   All other systems reviewed and are negative.   Physical  Exam: Estimated body mass index is 24.66 kg/m as calculated from the following:   Height as of this encounter: 5' 8.5" (1.74 m).   Weight as of this encounter: 164 lb 9.6 oz (74.7 kg). BP 104/68   Pulse 85   Temp 97.9 F (36.6 C)   Resp 17   Ht 5' 8.5" (1.74 m)   Wt 164 lb 9.6 oz (74.7 kg)   SpO2 99%   BMI 24.66 kg/m  General Appearance: Well nourished, in no apparent distress.  Eyes: PERRLA, EOMs, conjunctiva no swelling or erythema Sinuses: No Frontal/maxillary tenderness  ENT/Mouth: Ext aud canals clear, normal light reflex with TMs without erythema, bulging. Good dentition. No erythema, swelling, or exudate on post pharynx. Tonsils not swollen or erythematous. Hearing normal.  Neck: Supple, diffusely mildly enlarged thyroid without palpable nodules, tenderness Respiratory: Respiratory effort normal, BS equal bilaterally without rales, rhonchi, wheezing or stridor.  Cardio: RRR without murmurs, rubs or gallops. Brisk peripheral pulses without edema.  Chest: symmetric, with normal excursions and percussion.  Breasts: Defer to GYN Abdomen: Soft, nontender, no guarding, rebound, hernias, masses, or organomegaly.  Lymphatics: Non tender without lymphadenopathy.  Genitourinary: Defer to GYN Musculoskeletal: Full ROM all peripheral extremities,5/5 strength, and normal gait.  Skin: Warm, dry without rashes, lesions, ecchymosis. She has well healed surgical scar suprapubic, transverse without erythema or tenderness.  Neuro: Cranial nerves intact, reflexes equal bilaterally. Normal muscle tone, no cerebellar symptoms. Sensation intact.  Psych: Awake and oriented X 3, normal affect, Insight and Judgment appropriate.   EKG: Defer  ASRS Completed and Reviewed - Positive    Debra [name] 3:53 PM [facility]

## 2021-09-22 NOTE — Patient Instructions (Addendum)
Lisdexamfetamine Capsule What is this medication? LISDEXAMFETAMINE (lis DEX am fet a meen) treats attention-deficit hyperactivity disorder (ADHD). It may also be used to treat binge eating disorder. It works by reducing hyperactivity and impulsive behaviors. It belongs to a group of medications called stimulants. This medicine may be used for other purposes; ask your health care provider or pharmacist if you have questions. COMMON BRAND NAME(S): Vyvanse What should I tell my care team before I take this medication? They need to know if you have any of these conditions: Anxiety or panic attacks Circulation problems in fingers and toes Glaucoma Hardening or blockages of the arteries or heart blood vessels Heart disease or a heart defect High blood pressure History of a drug or alcohol abuse problem History of stroke Kidney disease Liver disease Mental illness Seizures Suicidal thoughts, plans, or attempt; a previous suicide attempt by you or a family member Thyroid disease Tourette's syndrome An unusual or allergic reaction to lisdexamfetamine, other medications, foods, dyes, or preservatives Pregnant or trying to get pregnant Breast-feeding How should I use this medication? Take this medication by mouth. Follow the directions on the prescription label. Swallow the capsules with a drink of water. You may open capsule and add to a glass of water, then drink right away. Take your doses at regular intervals. Do not take your medication more often than directed. Do not suddenly stop your medication. You must gradually reduce the dose, or you may feel withdrawal effects. Ask your care team for advice. A special MedGuide will be given to you by the pharmacist with each prescription and refill. Be sure to read this information carefully each time. Talk to your care team regarding the use of this medication in children. While this medication may be prescribed for children as young as 48 years of age  for selected conditions, precautions do apply. Overdosage: If you think you have taken too much of this medicine contact a poison control center or emergency room at once. NOTE: This medicine is only for you. Do not share this medicine with others. What if I miss a dose? If you miss a dose, take it as soon as you can. If it is almost time for your next dose, take only that dose. Do not take double or extra doses. What may interact with this medication? Do not take this medication with any of the following: MAOIs like Carbex, Eldepryl, Marplan, Nardil, and Parnate Other stimulant medications for attention disorders, weight loss, or to stay awake This medication may also interact with the following: Acetazolamide Ammonium chloride Antacids Ascorbic acid Atomoxetine Caffeine Certain medications for blood pressure Certain medications for depression, anxiety, or psychotic disturbances Certain medications for seizures like carbamazepine, phenobarbital, phenytoin Certain medications for stomach problems like cimetidine, famotidine, omeprazole, lansoprazole Cold or allergy medications Green tea Levodopa Linezolid Medications for sleep during surgery Methenamine Norepinephrine Phenothiazines like chlorpromazine, mesoridazine, prochlorperazine, thioridazine Propoxyphene Sodium acid phosphate Sodium bicarbonate This list may not describe all possible interactions. Give your health care provider a list of all the medicines, herbs, non-prescription drugs, or dietary supplements you use. Also tell them if you smoke, drink alcohol, or use illegal drugs. Some items may interact with your medicine. What should I watch for while using this medication? Visit your care team for regular check ups. This prescription requires that you follow special procedures with your care team and pharmacy. You will need to have a new written prescription from your care team every time you need a refill. This  medication may affect your concentration, or hide signs of tiredness. Until you know how this medication affects you, do not drive, ride a bicycle, use machinery, or do anything that needs mental alertness. Tell your care team if this medication loses its effects, or if you feel you need to take more than the prescribed amount. Do not change your dose without talking to your care team. Decreased appetite is a common side effect when starting this medication. Eating small, frequent meals or snacks can help. Talk to your care team if you continue to have poor eating habits. Height and weight growth of a child taking this medication will be monitored closely. Do not take this medication close to bedtime. It may prevent you from sleeping. If you are going to need surgery, a MRI, CT scan, or other procedure, tell your care team that you are taking this medication. You may need to stop taking this medication before the procedure. Tell your care team right away if you notice unexplained wounds on your fingers and toes while taking this medication. You should also tell your care team if you experience numbness or pain, changes in the skin color, or sensitivity to temperature in your fingers or toes. What side effects may I notice from receiving this medication? Side effects that you should report to your care team as soon as possible: Allergic reactions--skin rash, itching, hives, swelling of the face, lips, tongue, or throat Heart rhythm changes--fast or irregular heartbeat, dizziness, feeling faint or lightheaded, chest pain, trouble breathing Increase in blood pressure Mood and behavior changes--anxiety, nervousness, confusion, hallucinations, irritability, hostility, thoughts of suicide or self-harm, worsening mood, feelings of depression Painful or prolonged erection Raynaud's--cool, numb, or painful fingers or toes that may change color from pale, to blue, to red Stroke in adults--sudden numbness or  weakness of the face, arm, or leg, trouble speaking, confusion, trouble walking, loss of balance or coordination, dizziness, severe headache, change in vision Side effects that usually do not require medical attention (report to your care team if they continue or are bothersome): Anxiety, nervousness Blurry vision Headache Loss of appetite Nausea Trouble sleeping Weight loss This list may not describe all possible side effects. Call your doctor for medical advice about side effects. You may report side effects to FDA at 1-800-FDA-1088. Where should I keep my medication? Keep out of the reach of children and pets. This medication can be abused. Keep your medication in a safe place to protect it from theft. Do not share this medication with anyone. Selling or giving away this medication is dangerous and against the law. Store at room temperature between 15 and 30 degrees C (59 and 86 degrees F). Protect from light. Keep container tightly closed. Throw away any unused medication after the expiration date. NOTE: This sheet is a summary. It may not cover all possible information. If you have questions about this medicine, talk to your doctor, pharmacist, or health care provider.  2023 Elsevier/Gold Standard (2020-04-22 00:00:00) Attention Deficit Hyperactivity Disorder, Adult Attention deficit hyperactivity disorder (ADHD) is a mental health disorder that starts during childhood (neurodevelopmental disorder). For many people with ADHD, the disorder continues into the adult years. Treatment can help you manage your symptoms. What are the causes? The exact cause of ADHD is not known. Most experts believe genetics and environmental factors contribute to ADHD. What increases the risk? The following factors may make you more likely to develop this condition: Having a family history of ADHD. Being female. Being born to  a mother who smoked or drank alcohol during pregnancy. Being exposed to lead or other  toxins in the womb or early in life. Being born before 59 weeks of pregnancy (prematurely) or at a low birth weight. Having experienced a brain injury. What are the signs or symptoms? Symptoms of this condition depend on the type of ADHD. The two main types are inattentive and hyperactive-impulsive. Some people may have symptoms of both types. Symptoms of the inattentive type include: Difficulty paying attention. Making careless mistakes. Not following instructions. Being disorganized. Avoiding tasks that require time and attention. Losing and forgetting things. Being easily distracted. Symptoms of the hyperactive-impulsive type include: Restlessness. Talking too much. Interrupting. Difficulty with: Sitting still. Feeling motivated. Relaxing. Waiting in line or waiting for a turn. In adults, this condition may lead to certain problems, such as: Keeping jobs. Performing tasks at work. Having stable relationships. Being on time or keeping to a schedule. How is this diagnosed? This condition is diagnosed based on your current symptoms and your history of symptoms. The diagnosis can be made by a health care provider such as a primary care provider or a mental health care specialist. Your health care provider may use a symptom checklist or a behavior rating scale to evaluate your symptoms. He or she may also want to talk with people who have observed your behaviors throughout your life. How is this treated? This condition can be treated with medicines and behavior therapy. Medicines may be the best option to reduce impulsive behaviors and improve attention. Your health care provider may recommend: Stimulant medicines. These are the most common medicines used for adult ADHD. They affect certain chemicals in the brain (neurotransmitters) and improve your ability to control your symptoms. A non-stimulant medicine for adult ADHD (atomoxetine). This medicine increases a neurotransmitter called  norepinephrine. It may take weeks to months to see effects from this medicine. Counseling and behavioral management are also important for treating ADHD. Counseling is often used along with medicine. Your health care provider may suggest: Cognitive behavioral therapy (CBT). This type of therapy teaches you to replace negative thoughts and actions with positive thoughts and actions. When used as part of ADHD treatment, this therapy may also include: Coping strategies for organization, time management, impulse control, and stress reduction. Mindfulness and meditation training. Behavioral management. You may work with a [facility] [facility] manager who is specially trained to help people with ADHD manage and organize activities and function more effectively. Follow these instructions at home: Medicines  Take over-the-counter and prescription medicines only as told by your health care provider. Talk with your health care provider about the possible side effects of your medicines and how to manage them. Lifestyle  Do not use drugs. Do not drink alcohol if: Your health care provider tells you not to drink. You are pregnant, may be pregnant, or are planning to become pregnant. If you drink alcohol: Limit how much you use to: 0-1 drink a day for women. 0-2 drinks a day for men. Be aware of how much alcohol is in your drink. In the [place] one drink equals one 12 oz bottle of beer (355 mL), one 5 oz glass of wine (148 mL), or one 1 oz glass of hard liquor (44 mL). Get enough sleep. Eat a healthy diet. Exercise regularly. Exercise can help to reduce stress and anxiety. General instructions Learn as much as you can about adult ADHD, and work closely with your health care providers to find the treatments that work best for you.  Follow the same schedule each day. Use reminder devices like notes, calendars, and phone apps to stay on time and organized. Keep all follow-up visits as told by your health care provider and  therapist. This is important. Where to find more information A health care provider may be able to recommend resources that are available online or over the phone. You could start with: Attention Deficit Disorder Association (ADDA): PubAddiction.co.nz National Institute of Mental Health [employer] [facility]): https://carter.com/ Contact a health care provider if: Your symptoms continue to cause problems. You have side effects from your medicine, such as: Repeated muscle twitches, coughing, or speech outbursts. Sleep problems. Loss of appetite. Dizziness. Unusually fast heartbeat. Stomach pains. Headaches. You are struggling with anxiety, depression, or substance abuse. Get help right away if you: Have a severe reaction to a medicine. If you ever feel like you may hurt yourself or others, or have thoughts about taking your own life, get help right away. You can go to the nearest emergency department or call: Your local emergency services (911 in the [place] A suicide crisis helpline, such as the [place] at (514)123-1996 or 988 in the Short. This is open 24 hours a day. Summary ADHD is a mental health disorder that starts during childhood (neurodevelopmental disorder) and often continues into the adult years. The exact cause of ADHD is not known. Most experts believe genetics and environmental factors contribute to ADHD. There is no cure for ADHD, but treatment with medicine, cognitive behavioral therapy, or behavioral management can help you manage your condition. This information is not intended to replace advice given to you by your health care provider. Make sure you discuss any questions you have with your health care provider. Document Revised: 09/29/2020 Document Reviewed: 07/29/2018 Elsevier Patient Education  [place] Following a healthy eating pattern may help you to achieve and maintain a healthy body weight, reduce the risk of chronic disease,  and live a long and productive life. It is important to follow a healthy eating pattern at an appropriate calorie level for your body. Your nutritional needs should be met primarily through food by choosing a variety of nutrient-rich foods. What are tips for following this plan? Reading food labels Read labels and choose the following: Reduced or low sodium. Juices with 100% fruit juice. Foods with low saturated fats and high polyunsaturated and monounsaturated fats. Foods with whole grains, such as whole wheat, cracked wheat, brown rice, and wild rice. Whole grains that are fortified with folic acid. This is recommended for women who are pregnant or who want to become pregnant. Read labels and avoid the following: Foods with a lot of added sugars. These include foods that contain brown sugar, corn sweetener, corn syrup, dextrose, fructose, glucose, high-fructose corn syrup, honey, invert sugar, lactose, malt syrup, maltose, molasses, raw sugar, sucrose, trehalose, or turbinado sugar. Do not eat more than the following amounts of added sugar per day: 6 teaspoons (25 g) for women. 9 teaspoons (38 g) for men. Foods that contain processed or refined starches and grains. Refined grain products, such as white flour, degermed cornmeal, white bread, and white rice. Shopping Choose nutrient-rich snacks, such as vegetables, whole fruits, and nuts. Avoid high-calorie and high-sugar snacks, such as potato chips, fruit snacks, and candy. Use oil-based dressings and spreads on foods instead of solid fats such as butter, stick margarine, or cream cheese. Limit pre-made sauces, mixes, and "instant" products such as flavored rice, instant noodles, and ready-made  pasta. Try more plant-protein sources, such as tofu, tempeh, black beans, edamame, lentils, nuts, and seeds. Explore eating plans such as the Mediterranean diet or vegetarian diet. Cooking Use oil to saut or stir-fry foods instead of solid fats such  as butter, stick margarine, or lard. Try baking, boiling, grilling, or broiling instead of frying. Remove the fatty part of meats before cooking. Steam vegetables in water or broth. Meal planning  At meals, imagine dividing your plate into fourths: One-half of your plate is fruits and vegetables. One-fourth of your plate is whole grains. One-fourth of your plate is protein, especially lean meats, poultry, eggs, tofu, beans, or nuts. Include low-fat dairy as part of your daily diet. Lifestyle Choose healthy options in all settings, including home, work, school, restaurants, or stores. Prepare your food safely: Wash your hands after handling raw meats. Keep food preparation surfaces clean by regularly washing with hot, soapy water. Keep raw meats separate from ready-to-eat foods, such as fruits and vegetables. Cook seafood, meat, poultry, and eggs to the recommended internal temperature. Store foods at safe temperatures. In general: Keep cold foods at 85F (4.4C) or below. Keep hot foods at 185F (60C) or above. Keep your freezer at [facility] (-17.8C) or below. Foods are no longer safe to eat when they have been between the temperatures of 40-185F (4.4-60C) for more than 2 hours. What foods should I eat? Fruits Aim to eat 2 cup-equivalents of fresh, canned (in natural juice), or frozen fruits each day. Examples of 1 cup-equivalent of fruit include 1 small apple, 8 large strawberries, 1 cup canned fruit,  cup dried fruit, or 1 cup 100% juice. Vegetables Aim to eat 2-3 cup-equivalents of fresh and frozen vegetables each day, including different varieties and colors. Examples of 1 cup-equivalent of vegetables include 2 medium carrots, 2 cups raw, leafy greens, 1 cup chopped vegetable (raw or cooked), or 1 medium baked potato. Grains Aim to eat 6 ounce-equivalents of whole grains each day. Examples of 1 ounce-equivalent of grains include 1 slice of bread, 1 cup ready-to-eat cereal, 3 cups  popcorn, or  cup cooked rice, pasta, or cereal. Meats and other proteins Aim to eat 5-6 ounce-equivalents of protein each day. Examples of 1 ounce-equivalent of protein include 1 egg, 1/2 cup nuts or seeds, or 1 tablespoon (16 g) peanut butter. A cut of meat or fish that is the size of a deck of cards is about 3-4 ounce-equivalents. Of the protein you eat each week, try to have at least 8 ounces come from seafood. This includes salmon, trout, herring, and anchovies. Dairy Aim to eat 3 cup-equivalents of fat-free or low-fat dairy each day. Examples of 1 cup-equivalent of dairy include 1 cup (240 mL) milk, 8 ounces (250 g) yogurt, 1 ounces (44 g) natural cheese, or 1 cup (240 mL) fortified soy milk. Fats and oils Aim for about 5 teaspoons (21 g) per day. Choose monounsaturated fats, such as canola and olive oils, avocados, peanut butter, and most nuts, or polyunsaturated fats, such as sunflower, corn, and soybean oils, walnuts, pine nuts, sesame seeds, sunflower seeds, and flaxseed. Beverages Aim for six 8-oz glasses of water per day. Limit coffee to three to five 8-oz cups per day. Limit caffeinated beverages that have added calories, such as soda and energy drinks. Limit alcohol intake to no more than 1 drink a day for nonpregnant women and 2 drinks a day for men. One drink equals 12 oz of beer (355 mL), 5 oz of wine (148 mL),  or 1 oz of hard liquor (44 mL). Seasoning and other foods Avoid adding excess amounts of salt to your foods. Try flavoring foods with herbs and spices instead of salt. Avoid adding sugar to foods. Try using oil-based dressings, sauces, and spreads instead of solid fats. This information is based on general [place] nutrition guidelines. For more information, visit BuildDNA.es. Exact amounts may vary based on your nutrition needs. Summary A healthy eating plan may help you to maintain a healthy weight, reduce the risk of chronic diseases, and stay active throughout your  life. Plan your meals. Make sure you eat the right portions of a variety of nutrient-rich foods. Try baking, boiling, grilling, or broiling instead of frying. Choose healthy options in all settings, including home, work, school, restaurants, or stores. This information is not intended to replace advice given to you by your health care provider. Make sure you discuss any questions you have with your health care provider. Document Revised: 11/02/2020 Document Reviewed: 11/02/2020 Elsevier Patient Education  [place].

## 2021-09-23 ENCOUNTER — Encounter: Payer: Self-pay | Admitting: Nurse Practitioner

## 2021-09-23 LAB — COMPLETE METABOLIC PANEL WITH GFR
AG Ratio: 1.5 (calc) (ref 1.0–2.5)
ALT: 14 U/L (ref 6–29)
AST: 15 U/L (ref 10–30)
Albumin: 4.3 g/dL (ref 3.6–5.1)
Alkaline phosphatase (APISO): 49 U/L (ref 31–125)
BUN: 11 mg/dL (ref 7–25)
CO2: 28 mmol/L (ref 20–32)
Calcium: 9.4 mg/dL (ref 8.6–10.2)
Chloride: 106 mmol/L (ref 98–110)
Creat: 0.69 mg/dL (ref 0.50–0.97)
Globulin: 2.8 g/dL (calc) (ref 1.9–3.7)
Glucose, Bld: 87 mg/dL (ref 65–99)
Potassium: 4.2 mmol/L (ref 3.5–5.3)
Sodium: 141 mmol/L (ref 135–146)
Total Bilirubin: 0.6 mg/dL (ref 0.2–1.2)
Total Protein: 7.1 g/dL (ref 6.1–8.1)
eGFR: 114 mL/min/{1.73_m2} (ref >=60)

## 2021-09-23 LAB — URINALYSIS, ROUTINE W REFLEX MICROSCOPIC
Bacteria, UA: NONE SEEN /HPF
Bilirubin Urine: NEGATIVE
Glucose, UA: NEGATIVE
Hyaline Cast: NONE SEEN /LPF
Ketones, ur: NEGATIVE
Leukocytes,Ua: NEGATIVE
Nitrite: NEGATIVE
Protein, ur: NEGATIVE
Specific Gravity, Urine: 1.022 (ref 1.001–1.035)
Squamous Epithelial / HPF: NONE SEEN /HPF (ref <=5)
WBC, UA: NONE SEEN /HPF (ref 0–5)
pH: 6 (ref 5.0–8.0)

## 2021-09-23 LAB — CBC WITH DIFFERENTIAL/PLATELET
Absolute Monocytes: 314 cells/uL (ref 200–950)
Basophils Absolute: 39 cells/uL (ref 0–200)
Basophils Relative: 0.8 %
Eosinophils Absolute: 113 cells/uL (ref 15–500)
Eosinophils Relative: 2.3 %
HCT: 38.1 % (ref 35.0–45.0)
Hemoglobin: 12.6 g/dL (ref 11.7–15.5)
Lymphs Abs: 1950 cells/uL (ref 850–3900)
MCH: 28 pg (ref 27.0–33.0)
MCHC: 33.1 g/dL (ref 32.0–36.0)
MCV: 84.7 fL (ref 80.0–100.0)
MPV: 10.7 fL (ref 7.5–12.5)
Monocytes Relative: 6.4 %
Neutro Abs: 2484 cells/uL (ref 1500–7800)
Neutrophils Relative %: 50.7 %
Platelets: 253 10*3/uL (ref 140–400)
RBC: 4.5 10*6/uL (ref 3.80–5.10)
RDW: 12.4 % (ref 11.0–15.0)
Total Lymphocyte: 39.8 %
WBC: 4.9 10*3/uL (ref 3.8–10.8)

## 2021-09-23 LAB — LIPID PANEL
Cholesterol: 230 mg/dL — ABNORMAL HIGH (ref <200)
HDL: 59 mg/dL (ref >=50)
LDL Cholesterol (Calc): 131 mg/dL (calc) — ABNORMAL HIGH
Non-HDL Cholesterol (Calc): 171 mg/dL (calc) — ABNORMAL HIGH (ref <130)
Total CHOL/HDL Ratio: 3.9 (calc) (ref <5.0)
Triglycerides: 260 mg/dL — ABNORMAL HIGH (ref <150)

## 2021-09-23 LAB — VITAMIN D 25 HYDROXY (VIT D DEFICIENCY, FRACTURES): Vit D, 25-Hydroxy: 55 ng/mL (ref 30–100)

## 2021-09-23 LAB — TSH: TSH: 0.54 mIU/L

## 2021-10-25 ENCOUNTER — Ambulatory Visit: Payer: BC Managed Care – PPO | Admitting: Nurse Practitioner

## 2021-11-15 ENCOUNTER — Ambulatory Visit: Payer: BC Managed Care – PPO | Admitting: Nurse Practitioner

## 2021-12-20 ENCOUNTER — Encounter: Payer: BC Managed Care – PPO | Admitting: Adult Health

## 2022-03-05 IMAGING — US US THYROID
1 series · 13 of 25 positions shown · non-contrast
Comparison: None.

CLINICAL DATA: 34-year-old female with a history of enlarged
thyroid

EXAM:
THYROID ULTRASOUND
TECHNIQUE: Ultrasound examination of the thyroid gland and adjacent soft
tissues was performed.

[Series 1: us thyroid · 0.08mm/px · 13 of 82 slices shown]
[im 1/82]
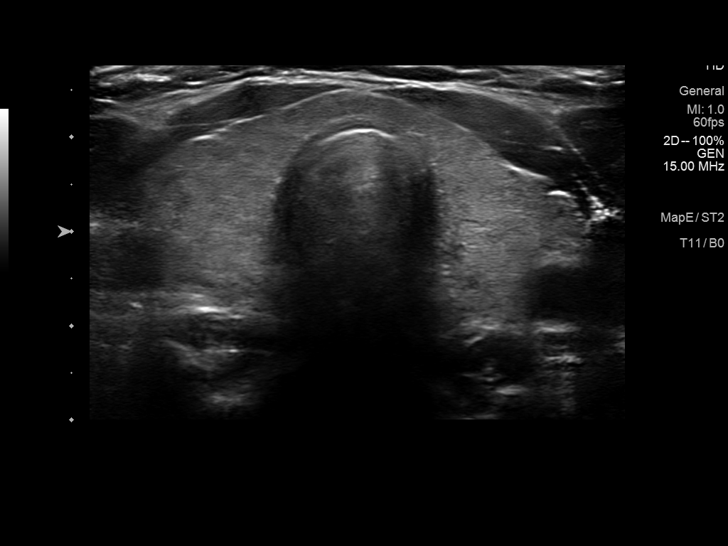
[im 7/82]
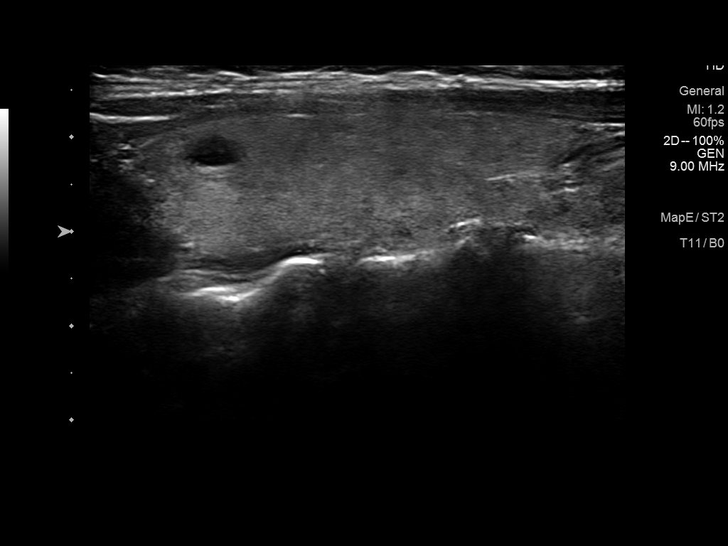
[im 14/82]
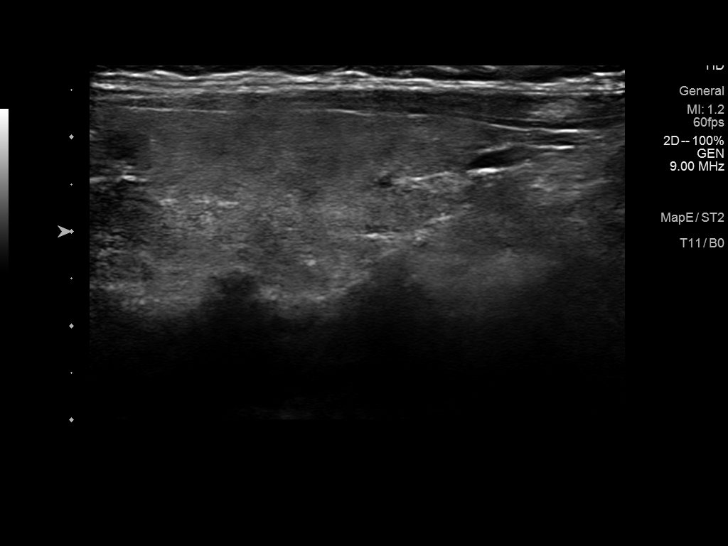
[im 21/82]
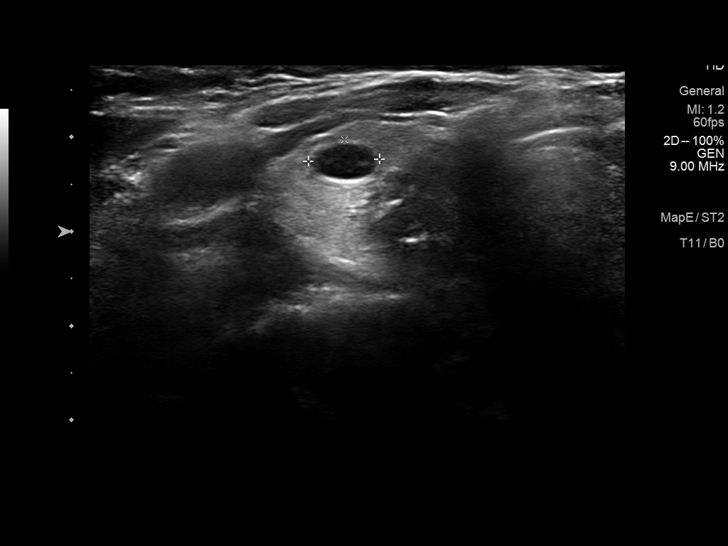
[im 28/82]
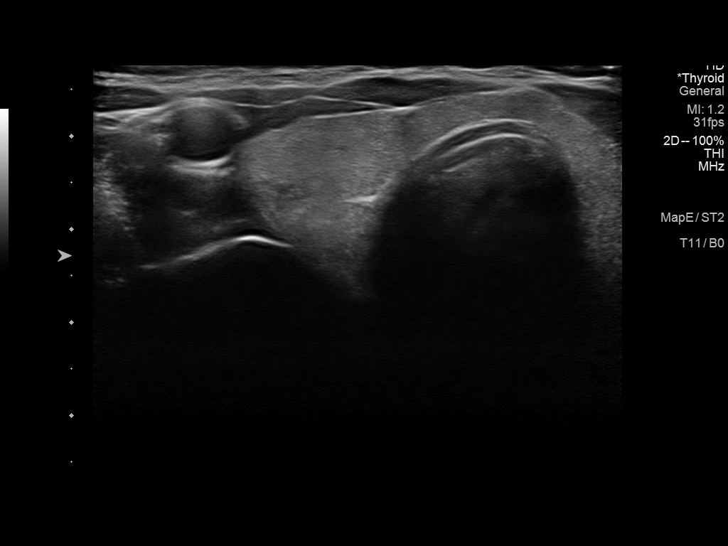
[im 34/82]
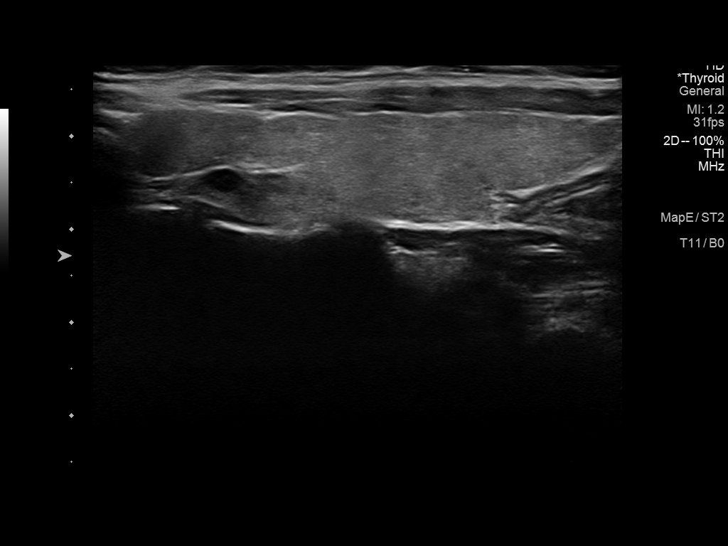
[im 41/82]
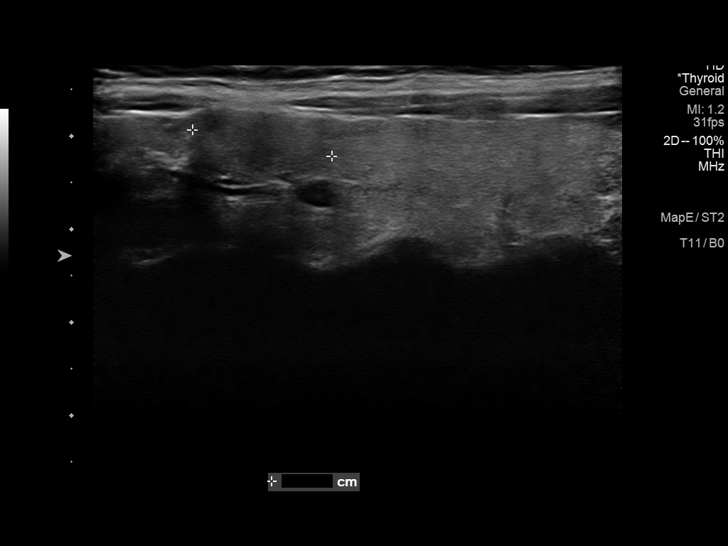
[im 48/82]
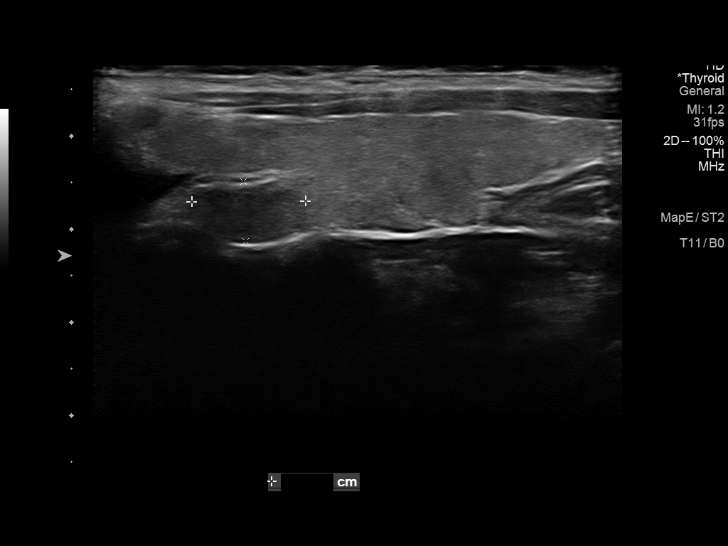
[im 55/82]
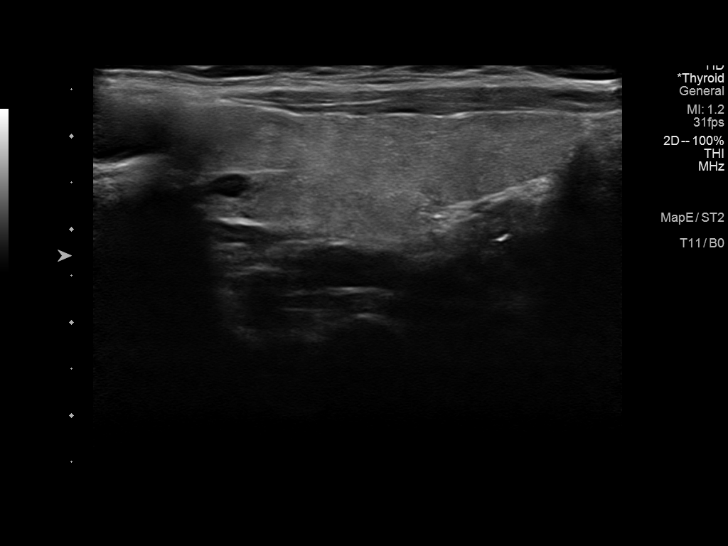
[im 61/82]
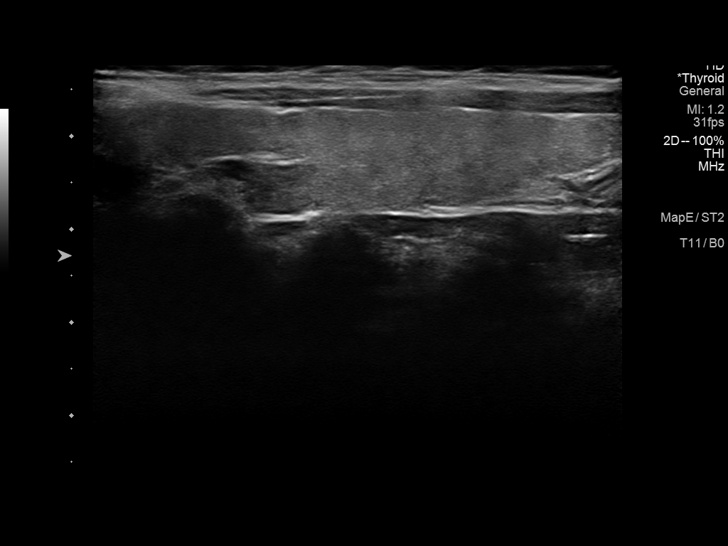
[im 68/82]
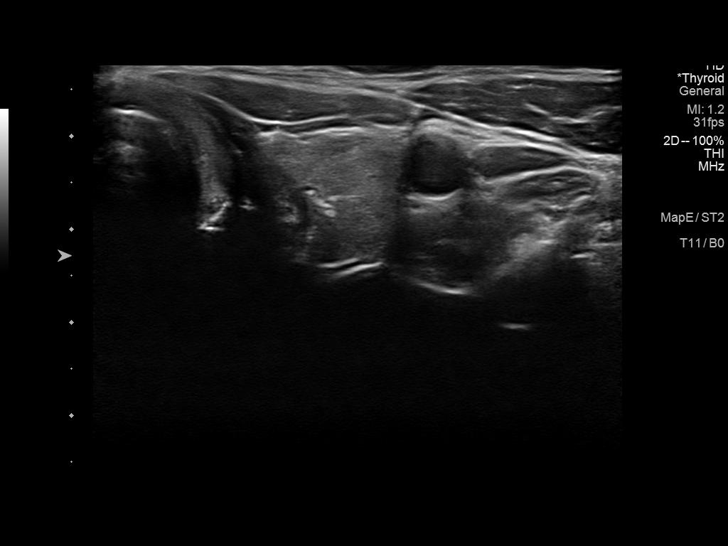
[im 75/82]
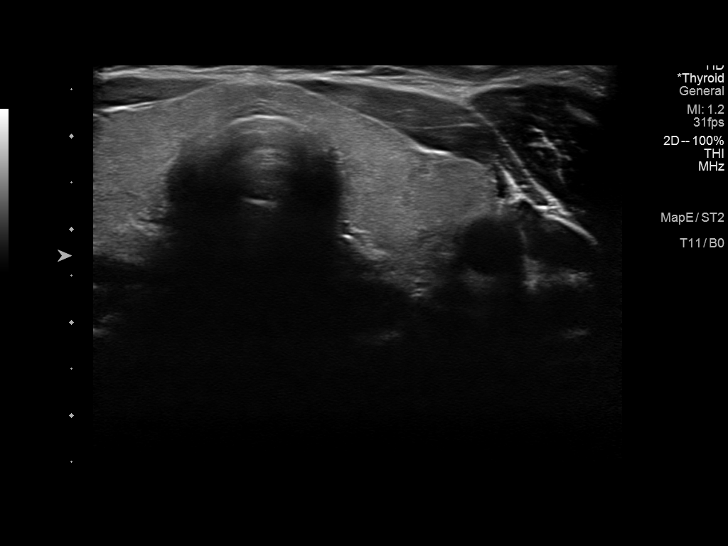
[im 82/82]
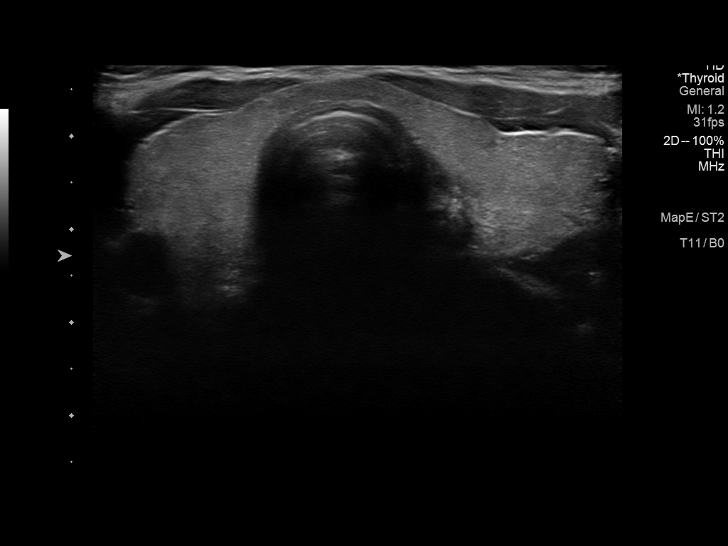

[13 of 25 positions shown; findings below may reference images not displayed]

FINDINGS: Parenchymal Echotexture: Mildly heterogenous

Isthmus: 0.3 cm

Right lobe: 5.1 cm x 1.8 cm x 2.1 cm

Left lobe: 5.5 cm x 1.3 cm x 1.9 cm

_________________________________________________________

Estimated total number of nodules >/= 1 cm: 2

Number of spongiform nodules >/=  2 cm not described below (TR1): 0

Number of mixed cystic and solid nodules >/= 1.5 cm not described
below (TR2): 0

_________________________________________________________

Right-sided nodule labeled 1 is cystic and does not meet criteria
for surveillance or biopsy.

Nodule # 2:

Location: Left; Superior

Maximum size: 1.5 cm; Other 2 dimensions: 0.8 cm x 0.9 cm

Composition: cannot determine (2)

Echogenicity: isoechoic (1)

Shape: not taller-than-wide (0)

Margins: ill-defined (0)

Echogenic foci: none (0)

ACR TI-RADS total points: 3.

ACR TI-RADS risk category: TR3 (3 points).

ACR TI-RADS recommendations:

Nodule meets criteria for surveillance

_________________________________________________________

Nodule # 3:

Location: Left; Mid

Maximum size: 1.2 cm; Other 2 dimensions: 0.7 cm x 1.0 cm

Composition: cannot determine (2)

Echogenicity: hypoechoic (2)

Shape: not taller-than-wide (0)

Margins: ill-defined (0)

Echogenic foci: none (0)

ACR TI-RADS total points: 4.

ACR TI-RADS risk category: TR4 (4-6 points).

ACR TI-RADS recommendations:

Nodule meets criteria for surveillance

_________________________________________________________

No adenopathy
IMPRESSION: Heterogeneous thyroid not significantly enlarged, may indicate
medical thyroid disease.

Left-sided thyroid nodules (labeled 2, 1.5 cm, and labeled 3,
cm) both meet criteria for surveillance, as designated by the newly
established ACR TI-RADS criteria. Surveillance ultrasound study
recommended to be performed annually up to 5 years.

Recommendations follow those established by the new ACR TI-RADS
criteria ([HOSPITAL] 8048;[DATE]).

## 2022-07-20 ENCOUNTER — Ambulatory Visit (INDEPENDENT_AMBULATORY_CARE_PROVIDER_SITE_OTHER): Payer: BC Managed Care – PPO | Admitting: Nurse Practitioner

## 2022-07-20 ENCOUNTER — Encounter: Payer: Self-pay | Admitting: Nurse Practitioner

## 2022-07-20 VITALS — BP 110/76 | HR 91 | Temp 98.4°F | Ht 68.5 in | Wt 163.8 lb

## 2022-07-20 DIAGNOSIS — J0100 Acute maxillary sinusitis, unspecified: Secondary | ICD-10-CM

## 2022-07-20 DIAGNOSIS — J029 Acute pharyngitis, unspecified: Secondary | ICD-10-CM | POA: Diagnosis not present

## 2022-07-20 MED ORDER — PREDNISONE 10 MG PO TABS: ORAL_TABLET | ORAL | 0 refills | Status: DC

## 2022-07-20 NOTE — Progress Notes (Signed)
Assessment and Plan:  Meghen P Erker was seen today for an episodic visit.  Diagnoses and all order for this visit:  Acute non-recurrent maxillary sinusitis Start prednisone taper Norel AD sample provided Continue OTC antihistamine Zyrtec. Stay well hydrated to help keep mucus thin and productive Contact office if s/s fail to improve.  - predniSONE (DELTASONE) 10 MG tablet; 1 tab 3 x day for 2 days, then 1 tab 2 x day for 2 days, then 1 tab 1 x day for 3 days  Dispense: 13 tablet; Refill: 0  Sore throat Warm salt water gargles several times throughout the day.  Notify office for further evaluation and treatment, questions or concerns if s/s fail to improve. The risks and benefits of my recommendations, as well as other treatment options were discussed with the patient today. Questions were answered.  Further disposition pending results of labs. Discussed med's effects and SE's.    Over 15 minutes of exam, counseling, chart review, and critical decision making was performed.   Future Appointments  Date Time Provider Department Center  09/25/2022  3:00 PM Zamari Vea, [name], NP [facility] None    ------------------------------------------------------------------------------------------------------------------   HPI BP 110/76   Pulse 91   Temp 98.4 F (36.9 C)   Ht 5' 8.5" (1.74 m)   Wt 163 lb 12.8 oz (74.3 kg)   SpO2 98%   BMI 24.54 kg/m    Patient complains of symptoms of a URI, possible sinusitis. Symptoms include facial pain, nasal congestion, post nasal drip, and sore throat. Onset of symptoms was 4 days ago, and has been unchanged since that time. Treatment to date: antihistamines.  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  Past Medical History:  Diagnosis Date   Breech birth C sec 11/13/2016   Hydronephrosis    R C/S 1/11 03/30/2018     No Known Allergies  Current Outpatient Medications on File Prior to Visit  Medication Sig   Multiple Vitamin  (MULTIVITAMIN ADULT PO) Take by mouth daily.   norelgestromin-ethinyl estradiol [employer]) 150-35 MCG/24HR transdermal patch Place 1 patch onto the skin once a week.   azithromycin (ZITHROMAX) 250 MG tablet Take 2 tablets with Food on  Day 1, then 1 tablet Daily with Food for Sinusitis / Bronchitis   dexamethasone (DECADRON) 4 MG tablet Take 1 tab 3 x day - 3 days, then 2 x day - 3 days, then 1 tab daily   lisdexamfetamine (VYVANSE) 30 MG capsule Take 1 capsule (30 mg total) by mouth daily. (Patient not taking: Reported on 07/20/2022)   No current facility-administered medications on file prior to visit.    ROS: all negative except what is noted in the HPI.   Physical Exam:  BP 110/76   Pulse 91   Temp 98.4 F (36.9 C)   Ht 5' 8.5" (1.74 m)   Wt 163 lb 12.8 oz (74.3 kg)   SpO2 98%   BMI 24.54 kg/m   General Appearance: NAD.  Awake, conversant and cooperative. Eyes: PERRLA, EOMs intact.  Sclera white.  Conjunctiva without erythema. Sinuses: No Frontal/maxillary tenderness.  No nasal discharge. Nares patent.  ENT/Mouth: Ext aud canals clear.  Bilateral TMs w/DOL and without erythema or bulging. Hearing intact.  Posterior pharynx without swelling or exudate.  Tonsils without swelling or erythema.  Neck: Supple.  No masses, nodules or thyromegaly. Respiratory: Effort is regular with non-labored breathing. Breath sounds are equal bilaterally without rales, rhonchi, wheezing or stridor.  Cardio: RRR with no MRGs. Brisk  peripheral pulses without edema.  Abdomen: Active BS in all four quadrants.  Soft and non-tender without guarding, rebound tenderness, hernias or masses. Lymphatics: Non tender without lymphadenopathy.  Musculoskeletal: Full ROM, 5/5 strength, normal ambulation.  No clubbing or cyanosis. Skin: Appropriate color for ethnicity. Warm without rashes, lesions, ecchymosis, ulcers.  Neuro: CN II-XII grossly normal. Normal muscle tone without cerebellar symptoms and intact sensation.    Psych: AO X 3,  appropriate mood and affect, insight and judgment.     [name], NP 2:16 PM [facility]

## 2022-07-20 NOTE — Patient Instructions (Signed)

## 2022-09-25 ENCOUNTER — Encounter: Payer: BC Managed Care – PPO | Admitting: Nurse Practitioner

## 2022-10-24 ENCOUNTER — Encounter: Payer: Self-pay | Admitting: Nurse Practitioner

## 2022-10-24 ENCOUNTER — Ambulatory Visit (INDEPENDENT_AMBULATORY_CARE_PROVIDER_SITE_OTHER): Payer: BC Managed Care – PPO | Admitting: Nurse Practitioner

## 2022-10-24 VITALS — BP 98/66 | HR 66 | Temp 97.8°F | Ht 68.5 in | Wt 158.0 lb

## 2022-10-24 DIAGNOSIS — Z0000 Encounter for general adult medical examination without abnormal findings: Secondary | ICD-10-CM | POA: Diagnosis not present

## 2022-10-24 DIAGNOSIS — Z13220 Encounter for screening for lipoid disorders: Secondary | ICD-10-CM

## 2022-10-24 DIAGNOSIS — Z131 Encounter for screening for diabetes mellitus: Secondary | ICD-10-CM

## 2022-10-24 DIAGNOSIS — Z130 Encounter for screening for diseases of the blood and blood-forming organs and certain disorders involving the immune mechanism: Secondary | ICD-10-CM

## 2022-10-24 DIAGNOSIS — Z1389 Encounter for screening for other disorder: Secondary | ICD-10-CM | POA: Diagnosis not present

## 2022-10-24 DIAGNOSIS — E785 Hyperlipidemia, unspecified: Secondary | ICD-10-CM

## 2022-10-24 DIAGNOSIS — Z0001 Encounter for general adult medical examination with abnormal findings: Secondary | ICD-10-CM

## 2022-10-24 DIAGNOSIS — E042 Nontoxic multinodular goiter: Secondary | ICD-10-CM

## 2022-10-24 DIAGNOSIS — E559 Vitamin D deficiency, unspecified: Secondary | ICD-10-CM

## 2022-10-24 DIAGNOSIS — Z79899 Other long term (current) drug therapy: Secondary | ICD-10-CM

## 2022-10-24 DIAGNOSIS — F988 Other specified behavioral and emotional disorders with onset usually occurring in childhood and adolescence: Secondary | ICD-10-CM

## 2022-10-24 DIAGNOSIS — E538 Deficiency of other specified B group vitamins: Secondary | ICD-10-CM

## 2022-10-24 LAB — CBC WITH DIFFERENTIAL/PLATELET
Absolute Monocytes: 362 cells/uL (ref 200–950)
Basophils Absolute: 38 cells/uL (ref 0–200)
Basophils Relative: 0.7 %
Eosinophils Absolute: 59 cells/uL (ref 15–500)
Eosinophils Relative: 1.1 %
HCT: 39 % (ref 35.0–45.0)
Hemoglobin: 13 g/dL (ref 11.7–15.5)
Lymphs Abs: 1285 cells/uL (ref 850–3900)
MCH: 28.2 pg (ref 27.0–33.0)
MCHC: 33.3 g/dL (ref 32.0–36.0)
MCV: 84.6 fL (ref 80.0–100.0)
MPV: 10.9 fL (ref 7.5–12.5)
Monocytes Relative: 6.7 %
Neutro Abs: 3656 cells/uL (ref 1500–7800)
Neutrophils Relative %: 67.7 %
Platelets: 281 10*3/uL (ref 140–400)
RBC: 4.61 10*6/uL (ref 3.80–5.10)
RDW: 12.6 % (ref 11.0–15.0)
Total Lymphocyte: 23.8 %
WBC: 5.4 10*3/uL (ref 3.8–10.8)

## 2022-10-24 MED ORDER — LISDEXAMFETAMINE DIMESYLATE 30 MG PO CAPS
30.0000 mg | ORAL_CAPSULE | Freq: Every day | ORAL | 0 refills | Status: DC
Start: 2022-10-24 — End: 2022-11-28

## 2022-10-24 NOTE — Patient Instructions (Addendum)
YOU CAN CALL TO MAKE AN ULTRASOUND..  I have put in an order for an ultrasound for you to have You can set them up at your convenience by calling this number [phone] You will likely have the ultrasound at [address]  If you have any issues call our office and we will set this up for you.    Healthy Eating, Adult Healthy eating may help you get and keep a healthy body weight, reduce the risk of chronic disease, and live a long and productive life. It is important to follow a healthy eating pattern. Your nutritional and calorie needs should be met mainly by different nutrient-rich foods. What are tips for following this plan? Reading food labels Read labels and choose the following: Reduced or low sodium products. Juices with 100% fruit juice. Foods with low saturated fats (<3 g per serving) and high polyunsaturated and monounsaturated fats. Foods with whole grains, such as whole wheat, cracked wheat, brown rice, and wild rice. Whole grains that are fortified with folic acid. This is recommended for females who are pregnant or who want to become pregnant. Read labels and do not eat or drink the following: Foods or drinks with added sugars. These include foods that contain brown sugar, corn sweetener, corn syrup, dextrose, fructose, glucose, high-fructose corn syrup, honey, invert sugar, lactose, malt syrup, maltose, molasses, raw sugar, sucrose, trehalose, or turbinado sugar. Limit your intake of added sugars to less than 10% of your total daily calories. Do not eat more than the following amounts of added sugar per day: 6 teaspoons (25 g) for females. 9 teaspoons (38 g) for males. Foods that contain processed or refined starches and grains. Refined grain products, such as white flour, degermed cornmeal, white bread, and white rice. Shopping Choose nutrient-rich snacks, such as vegetables, whole fruits, and nuts. Avoid high-calorie and high-sugar snacks, such as potato  chips, fruit snacks, and candy. Use oil-based dressings and spreads on foods instead of solid fats such as butter, margarine, sour cream, or cream cheese. Limit pre-made sauces, mixes, and "instant" products such as flavored rice, instant noodles, and ready-made pasta. Try more plant-protein sources, such as tofu, tempeh, black beans, edamame, lentils, nuts, and seeds. Explore eating plans such as the Mediterranean diet or vegetarian diet. Try heart-healthy dips made with beans and healthy fats like hummus and guacamole. Vegetables go great with these. Cooking Use oil to saut or stir-fry foods instead of solid fats such as butter, margarine, or lard. Try baking, boiling, grilling, or broiling instead of frying. Remove the fatty part of meats before cooking. Steam vegetables in water or broth. Meal planning  At meals, imagine dividing your plate into fourths: One-half of your plate is fruits and vegetables. One-fourth of your plate is whole grains. One-fourth of your plate is protein, especially lean meats, poultry, eggs, tofu, beans, or nuts. Include low-fat dairy as part of your daily diet. Lifestyle Choose healthy options in all settings, including home, work, school, restaurants, or stores. Prepare your food safely: Wash your hands after handling raw meats. Where you prepare food, keep surfaces clean by regularly washing with hot, soapy water. Keep raw meats separate from ready-to-eat foods, such as fruits and vegetables. Cook seafood, meat, poultry, and eggs to the recommended temperature. Get a food thermometer. Store foods at safe temperatures. In general: Keep cold foods at 94F (4.4C) or below. Keep hot foods at 194F (60C) or above. Keep your freezer at [facility] (-17.8C) or below.  Foods are not safe to eat if they have been between the temperatures of 40-140F (4.4-60C) for more than 2 hours. What foods should I eat? Fruits Aim to eat 1-2 cups of fresh, canned (in natural  juice), or frozen fruits each day. One cup of fruit equals 1 small apple, 1 large banana, 8 large strawberries, 1 cup (237 g) canned fruit,  cup (82 g) dried fruit, or 1 cup (240 mL) 100% juice. Vegetables Aim to eat 2-4 cups of fresh and frozen vegetables each day, including different varieties and colors. One cup of vegetables equals 1 cup (91 g) broccoli or cauliflower florets, 2 medium carrots, 2 cups (150 g) raw, leafy greens, 1 large tomato, 1 large bell pepper, 1 large sweet potato, or 1 medium white potato. Grains Aim to eat 5-10 ounce-equivalents of whole grains each day. Examples of 1 ounce-equivalent of grains include 1 slice of bread, 1 cup (40 g) ready-to-eat cereal, 3 cups (24 g) popcorn, or  cup (93 g) cooked rice. Meats and other proteins Try to eat 5-7 ounce-equivalents of protein each day. Examples of 1 ounce-equivalent of protein include 1 egg,  oz nuts (12 almonds, 24 pistachios, or 7 walnut halves), 1/4 cup (90 g) cooked beans, 6 tablespoons (90 g) hummus or 1 tablespoon (16 g) peanut butter. A cut of meat or fish that is the size of a deck of cards is about 3-4 ounce-equivalents (85 g). Of the protein you eat each week, try to have at least 8 sounce (227 g) of seafood. This is about 2 servings per week. This includes salmon, trout, herring, sardines, and anchovies. Dairy Aim to eat 3 cup-equivalents of fat-free or low-fat dairy each day. Examples of 1 cup-equivalent of dairy include 1 cup (240 mL) milk, 8 ounces (250 g) yogurt, 1 ounces (44 g) natural cheese, or 1 cup (240 mL) fortified soy milk. Fats and oils Aim for about 5 teaspoons (21 g) of fats and oils per day. Choose monounsaturated fats, such as canola and olive oils, mayonnaise made with olive oil or avocado oil, avocados, peanut butter, and most nuts, or polyunsaturated fats, such as sunflower, corn, and soybean oils, walnuts, pine nuts, sesame seeds, sunflower seeds, and flaxseed. Beverages Aim for 6 eight-ounce  glasses of water per day. Limit coffee to 3-5 eight-ounce cups per day. Limit caffeinated beverages that have added calories, such as soda and energy drinks. If you drink alcohol: Limit how much you have to: 0-1 drink a day if you are female. 0-2 drinks a day if you are female. Know how much alcohol is in your drink. In the [place] one drink is one 12 oz bottle of beer (355 mL), one 5 oz glass of wine (148 mL), or one 1 oz glass of hard liquor (44 mL). Seasoning and other foods Try not to add too much salt to your food. Try using herbs and spices instead of salt. Try not to add sugar to food. This information is based on [place] nutrition guidelines. To learn more, visit DisposableNylon.be. Exact amounts may vary. You may need different amounts. This information is not intended to replace advice given to you by your health care provider. Make sure you discuss any questions you have with your health care provider. Document Revised: 12/05/2021 Document Reviewed: 12/05/2021 Elsevier Patient Education  2024 [employer].

## 2022-10-24 NOTE — Progress Notes (Signed)
Encounter for Complete Physical  Assessment and Plan:  Encounter for general adult medical examination with abnormal findings Due annually  Health maintenance reviewed Healthily lifestyle goals set  Hyperlipidemia, mild Uncontrolled Discussed lifestyle modifications. Recommended diet heavy in fruits and veggies, omega 3's. Decrease consumption of animal meats, cheeses, and dairy products. Remain active and exercise as tolerated. Continue to monitor.  Vitamin D deficiency Continue supplement for goal of 60-100 Monitor Vitamin D levels  Multiple thyroid nodules Last [place] Clear Continue annual screening for 5 years. Mix up with scheduling last year.  Last review 12/2020 - Overdue - Ordered  Screening for hematuria or proteinuria  - Urinalysis, Routine w reflex microscopic  Attention deficit disorder (ADD) in adult Start Vyvanse Adult ADHD Self-Report Scale (ASRS-v1.1) Symptom Checklist highly consistent with diagnosis. Discussed behavioral therapy. Exercise. Healthy diet. Sleep hygiene. Mindful meditation.  Medication management All medications discussed and reviewed in full. All questions and concerns regarding medications addressed.    Screening for DM Check A1c and Insulin  B-12 Deficiency Monitor levels  Orders Placed This Encounter  Procedures   US THYROID    Standing Status:   Future    Standing Expiration Date:   10/24/2023    Order Specific Question:   Reason for Exam (SYMPTOM  OR DIAGNOSIS REQUIRED)    Answer:   Annual screening for left thyroid nodules    Order Specific Question:   Preferred imaging location?    Answer:   [facility]   CBC with Differential/Platelet   COMPLETE METABOLIC PANEL WITH GFR   Lipid panel   TSH   Hemoglobin A1c   Insulin, random   VITAMIN D 25 Hydroxy (Vit-D Deficiency, Fractures)   Urinalysis, Routine w reflex microscopic   Microalbumin / creatinine urine ratio   Vitamin B12   Meds ordered this encounter   Medications   lisdexamfetamine (VYVANSE) 30 MG capsule    Sig: Take 1 capsule (30 mg total) by mouth daily.    Dispense:  30 capsule    Refill:  0    Order Specific Question:   Supervising Provider    Answer:   [name] [phone]    Notify office for further evaluation and treatment, questions or concerns if any reported s/s fail to improve.   The patient was advised to call back or seek an in-person evaluation if any symptoms worsen or if the condition fails to improve as anticipated.   Further disposition pending results of labs. Discussed med's effects and SE's.    I discussed the assessment and treatment plan with the patient. The patient was provided an opportunity to ask questions and all were answered. The patient agreed with the plan and demonstrated an understanding of the instructions.  Discussed med's effects and SE's. Screening labs and tests as requested with regular follow-up as recommended.  I provided 35 minutes of face-to-face time during this encounter including counseling, chart review, and critical decision making was preformed.  Today's Plan of Care is based on a patient-centered health care approach known as shared decision making - the decisions, tests and treatments allow for patient preferences and values to be balanced with clinical evidence.     Future Appointments  Date Time Provider Department Center  10/24/2023  9:00 AM [name], NP [facility] None     HPI  37 y.o. female, presents for a complete physical. She has Hyperlipidemia, mild; Vitamin D deficiency; and Multiple thyroid nodules on their problem list.   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  Has been enjoying summer traveling to [place] parks.  Oldest child started kindergarten today.   Patient c/o symptoms of inattention, impulsivity, and restlessness, resulting in functional impairment.  Reports symptoms since 28, more noticeable but also  notes these symptoms as an adolescent/child.  No attention to details, difficulty sustaining attention, does not follow instructions, forgetful.  Often fidgets, unable to engage in leisure activities quietly, talks excessively, interrupts conversations.  She was started on Vyvase but was unable to obtain due to shortage.  She continues to have symptoms and requesting to restart medication.    She follows with OBGYN ([name]) for feminine concerns, last PAP completed in 2021.  Unsure if due this year - will reach out to GYN to confirm. Hx of hydronephrosis without complication or intervention during her first pregnancy. She is newly on xulane patch and sometimes doubles up to skip menstruation.  LMP 08/2022 - no concern for pregnancy.  BMI is Body mass index is 23.67 kg/m., she has been working on diet and exercise. She sees a chiropractor occasionally, and does yoga, uses apple watch to track, 30 min daily of activity Drinks alcohol once weekly socially.  Wt Readings from Last 3 Encounters:  10/24/22 158 lb (71.7 kg)  07/20/22 163 lb 12.8 oz (74.3 kg)  09/22/21 164 lb 9.6 oz (74.7 kg)   Today their BP is BP: 98/66 She does workout. She denies chest pain, shortness of breath, dizziness.    She is not on cholesterol medication and denies myalgias. Her cholesterol is not at goal. The cholesterol last visit was:   Lab Results  Component Value Date   CHOL 230 (H) 09/22/2021   HDL 59 09/22/2021   LDLCALC 131 (H) 09/22/2021   TRIG 260 (H) 09/22/2021   CHOLHDL 3.9 09/22/2021    She has been working on diet and exercise for glucose management, and denies increased appetite, nausea, paresthesia of the feet, polydipsia, polyuria and visual disturbances. Last A1C in the office was:  Lab Results  Component Value Date   HGBA1C 5.3 09/01/2019   Patient is not currently on Vitamin D supplement.   Lab Results  Component Value Date   VD25OH 55 09/22/2021     09/01/19 felt enlarged thyroid on exam,  had Normal TSH, TPO. Thyroid US 6/24/2021showed 2 L nodules meeting criteria for annual [place] surveillance for 5 years. Last completed 12/2020. Previously seen left thyroid nodules are not identified on the current examination.  She will continue to follow with yearly screening for the next few years. Lab Results  Component Value Date   TSH 0.54 09/22/2021      Current Medications:  Current Outpatient Medications on File Prior to Visit  Medication Sig Dispense Refill   lisdexamfetamine (VYVANSE) 30 MG capsule Take 1 capsule (30 mg total) by mouth daily. 30 capsule 0   Multiple Vitamin (MULTIVITAMIN ADULT PO) Take by mouth daily.     norelgestromin-ethinyl estradiol [employer]) 150-35 MCG/24HR transdermal patch Place 1 patch onto the skin once a week.     predniSONE (DELTASONE) 10 MG tablet 1 tab 3 x day for 2 days, then 1 tab 2 x day for 2 days, then 1 tab 1 x day for 3 days 13 tablet 0   No current facility-administered medications on file prior to visit.   Allergies:  No Known Allergies Medical History:  She has Hyperlipidemia, mild; Vitamin D deficiency; and Multiple thyroid nodules on their problem list. Health  Maintenance:   Immunization History  Administered Date(s) Administered   Influenza Inj Mdck Quad With Preservative 01/13/2019   Influenza-Unspecified 12/14/2017   PFIZER(Purple Top)SARS-COV-2 Vaccination 04/24/2019, 05/15/2019, 12/14/2019, 11/23/2020   Tdap 11/15/2016, 01/24/2018    Tetanus: 01/2018 Flu vaccine: 12/2021 HPV: 0/3, will check with insurance  Covid 19:   LMP: 08/2022 Continue BC Patch Pap: Checking with GYN - last known 2021 MGM: - not yet due  Colonoscopy: - not yet due  EGD: -  Last Dental Exam: Dr. May 2024, sees q6 month Last Eye Exam: 2024 - no correction needed at this time.   Patient Care Team: [name], MD as PCP - General (Internal Medicine)  Surgical History:  She has a past surgical history that includes Abdominal surgery;  Anterior cruciate ligament repair (Left, 02/2015); Cesarean section (N/A, 11/13/2016); and Cesarean section (N/A, 03/30/2018). Family History:  Herfamily history includes Alcohol abuse in her father and paternal grandfather; Alzheimer's disease in her paternal grandmother; Arthritis/Rheumatoid in her paternal aunt; Breast cancer in her maternal grandmother and mother; Pancreatic cancer in her maternal uncle. Social History:  She reports that she has never smoked. She has never used smokeless tobacco. She reports current alcohol use of about 3.0 standard drinks of alcohol per week. She reports that she does not use drugs.  Review of Systems: Review of Systems  Constitutional:  Negative for malaise/fatigue and weight loss.  HENT:  Negative for hearing loss and tinnitus.   Eyes:  Negative for blurred vision and double vision.  Respiratory:  Negative for cough, sputum production, shortness of breath and wheezing.   Cardiovascular:  Negative for chest pain, palpitations, orthopnea, claudication, leg swelling and PND.  Gastrointestinal:  Negative for abdominal pain, blood in stool, constipation, diarrhea, heartburn, melena, nausea and vomiting.  Genitourinary: Negative.   Musculoskeletal:  Negative for joint pain and myalgias.  Skin:  Negative for rash.  Neurological:  Negative for dizziness, tingling, sensory change, weakness and headaches.  Endo/Heme/Allergies:  Negative for polydipsia.  Psychiatric/Behavioral: Negative.  Negative for depression, memory loss, substance abuse and suicidal ideas. The patient is not nervous/anxious and does not have insomnia.   All other systems reviewed and are negative.   Physical Exam: Estimated body mass index is 23.67 kg/m as calculated from the following:   Height as of this encounter: 5' 8.5" (1.74 m).   Weight as of this encounter: 158 lb (71.7 kg). BP 98/66   Pulse 66   Temp 97.8 F (36.6 C)   Ht 5' 8.5" (1.74 m)   Wt 158 lb (71.7 kg)   SpO2 98%    BMI 23.67 kg/m  General Appearance: Well nourished, in no apparent distress.  Eyes: PERRLA, EOMs, conjunctiva no swelling or erythema Sinuses: No Frontal/maxillary tenderness  ENT/Mouth: Ext aud canals clear, normal light reflex with TMs without erythema, bulging. Good dentition. No erythema, swelling, or exudate on post pharynx. Tonsils not swollen or erythematous. Hearing normal.  Neck: Supple, diffusely mildly enlarged thyroid without palpable nodules, tenderness Respiratory: Respiratory effort normal, BS equal bilaterally without rales, rhonchi, wheezing or stridor.  Cardio: RRR without murmurs, rubs or gallops. Brisk peripheral pulses without edema.  Chest: symmetric, with normal excursions and percussion.  Breasts: Defer to GYN Abdomen: Soft, nontender, no guarding, rebound, hernias, masses, or organomegaly.  Lymphatics: Non tender without lymphadenopathy.  Genitourinary: Defer to GYN Musculoskeletal: Full ROM all peripheral extremities,5/5 strength, and normal gait.  Skin: Warm, dry without rashes, lesions, ecchymosis. She has well healed surgical scar suprapubic, transverse  without erythema or tenderness.  Neuro: Cranial nerves intact, reflexes equal bilaterally. Normal muscle tone, no cerebellar symptoms. Sensation intact.  Psych: Awake and oriented X 3, normal affect, Insight and Judgment appropriate.   EKG: Defer  ASRS Completed and Reviewed - Positive    Debra [name] 9:00 AM [facility]

## 2022-10-27 ENCOUNTER — Ambulatory Visit
Admission: RE | Admit: 2022-10-27 | Discharge: 2022-10-27 | Disposition: A | Discharge status: Home / Self Care | Payer: BC Managed Care – PPO | Source: Ambulatory Visit | Attending: Nurse Practitioner | Admitting: Nurse Practitioner

## 2022-10-27 DIAGNOSIS — E042 Nontoxic multinodular goiter: Secondary | ICD-10-CM

## 2022-10-31 ENCOUNTER — Encounter: Payer: Self-pay | Admitting: Nurse Practitioner

## 2022-10-31 DIAGNOSIS — F988 Other specified behavioral and emotional disorders with onset usually occurring in childhood and adolescence: Secondary | ICD-10-CM

## 2022-11-28 MED ORDER — LISDEXAMFETAMINE DIMESYLATE 30 MG PO CAPS: 30.0000 mg | ORAL_CAPSULE | Freq: Every day | ORAL | 0 refills | Status: DC

## 2023-05-01 ENCOUNTER — Ambulatory Visit: Payer: Self-pay | Admitting: Nurse Practitioner

## 2023-06-21 IMAGING — US US THYROID
1 series · 14 of 25 positions shown · non-contrast
Comparison: 09/11/2019

CLINICAL DATA: Thyroid nodule follow-up

EXAM:
THYROID ULTRASOUND
TECHNIQUE: Ultrasound examination of the thyroid gland and adjacent soft
tissues was performed.

[Series 1: us thyroid · 0.06mm/px · 14 of 32 slices shown]
[im 1/32]
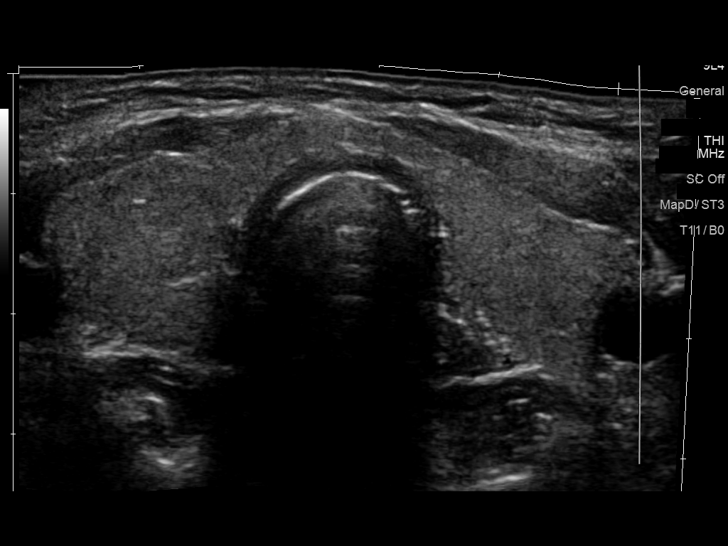
[im 3/32]
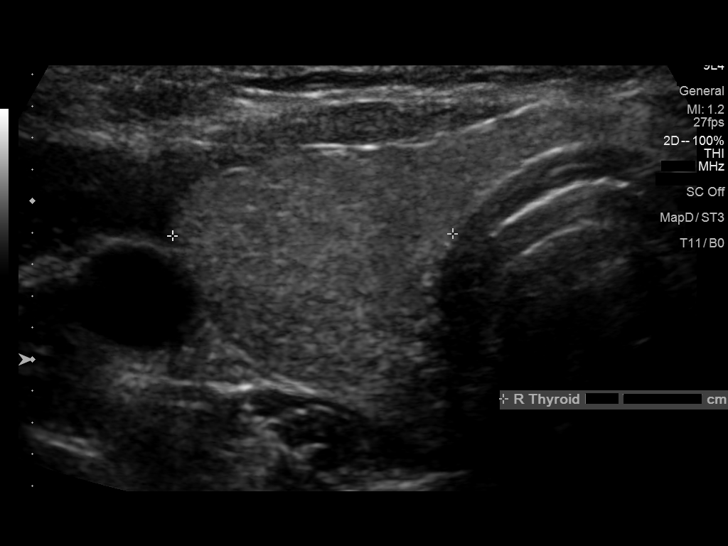
[im 6/32]
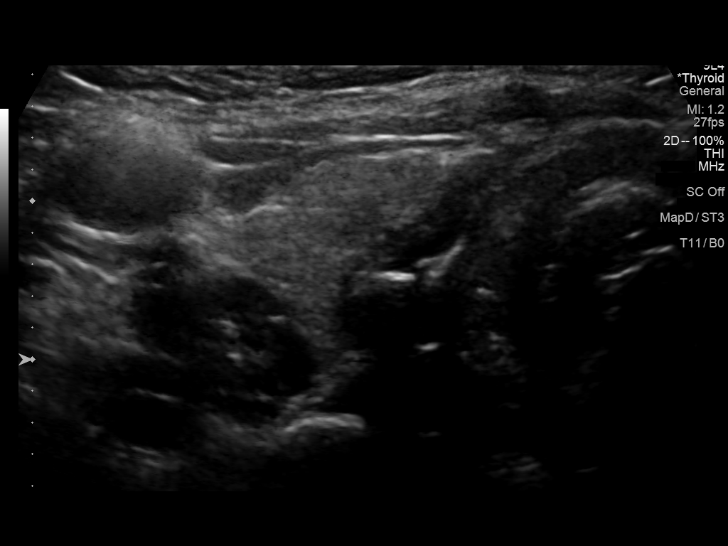
[im 8/32]
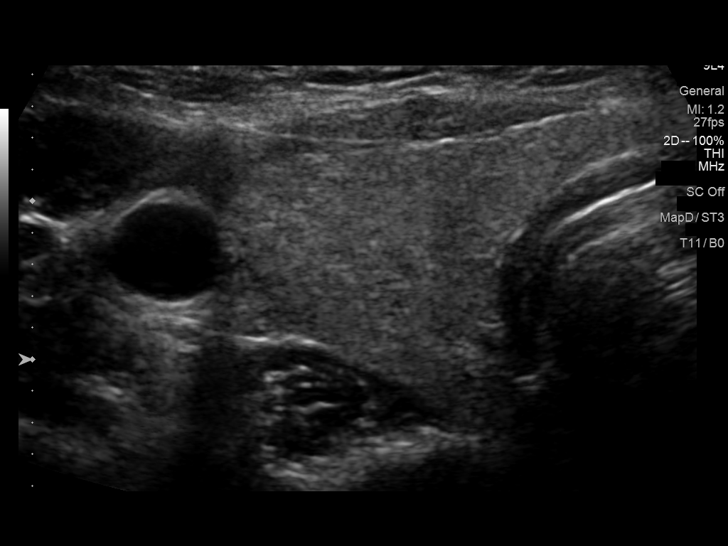
[im 11/32]
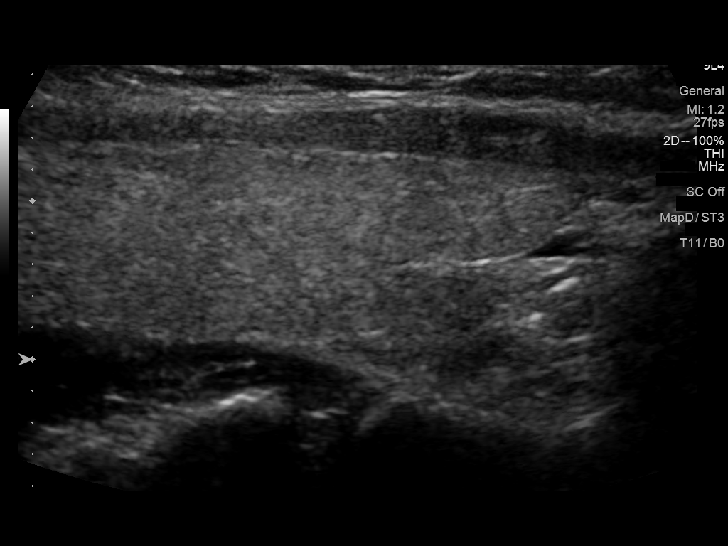
[im 12/32]
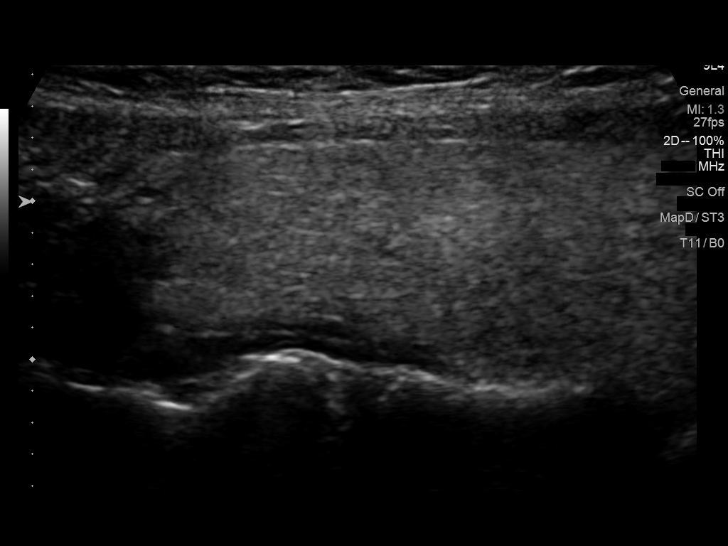
[im 15/32]
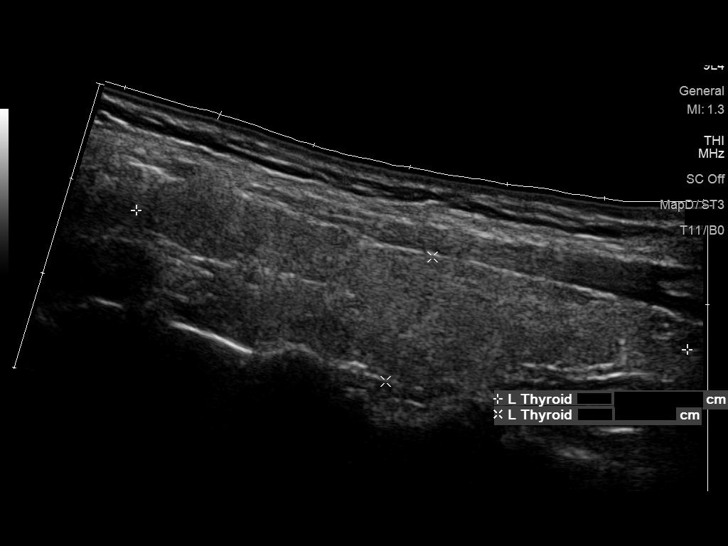
[im 17/32]
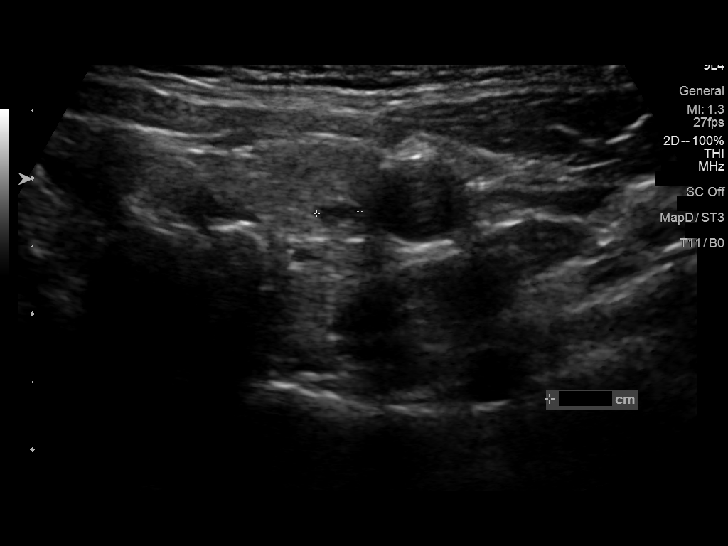
[im 20/32]
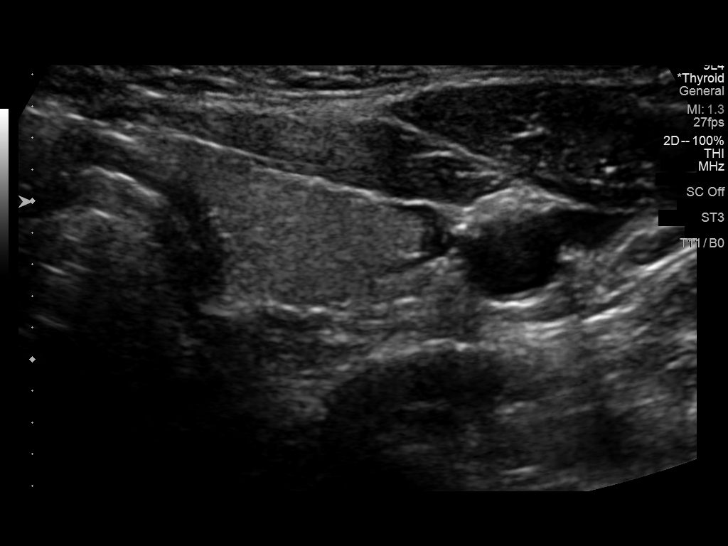
[im 21/32]
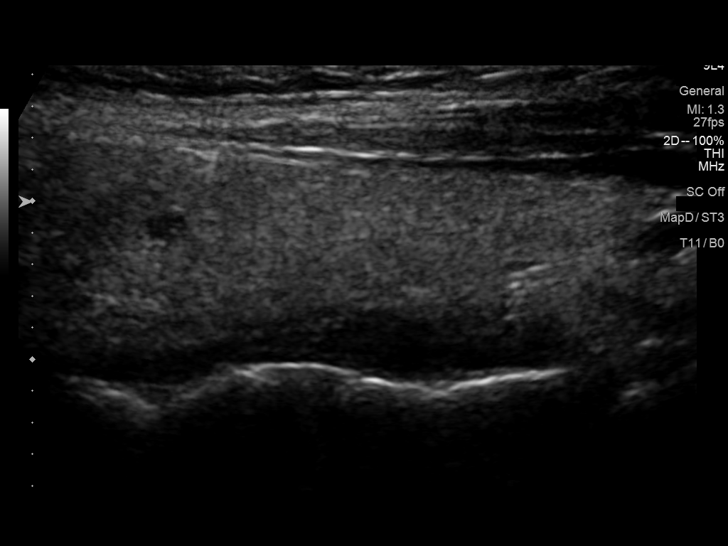
[im 24/32]
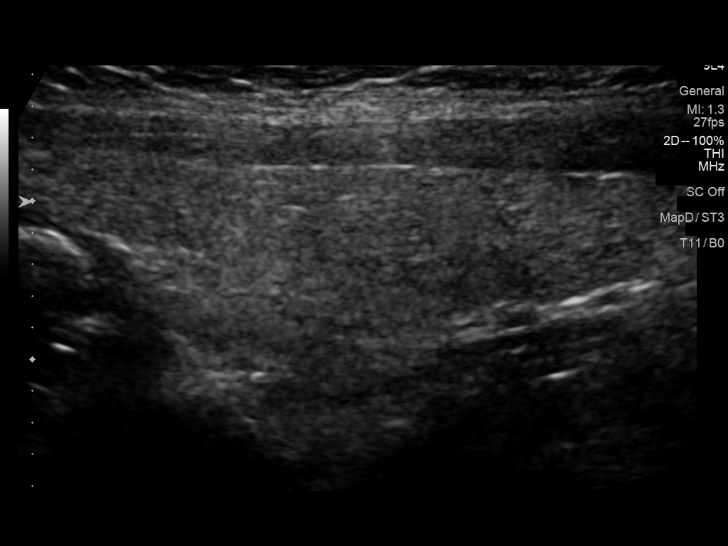
[im 26/32]
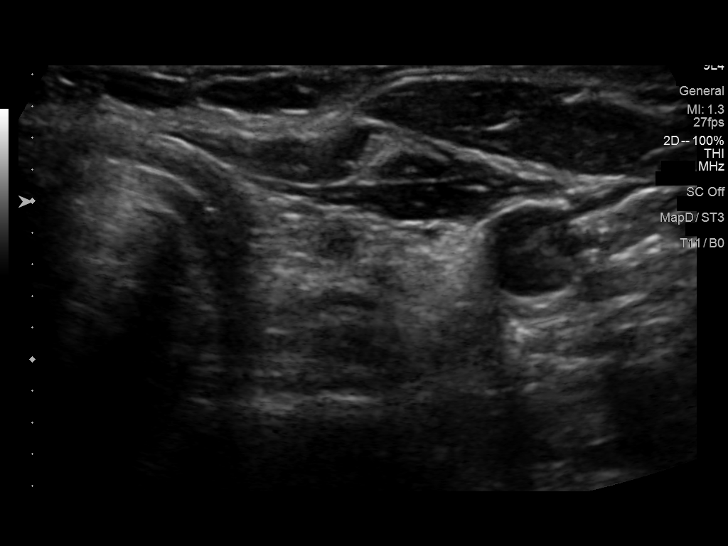
[im 29/32]
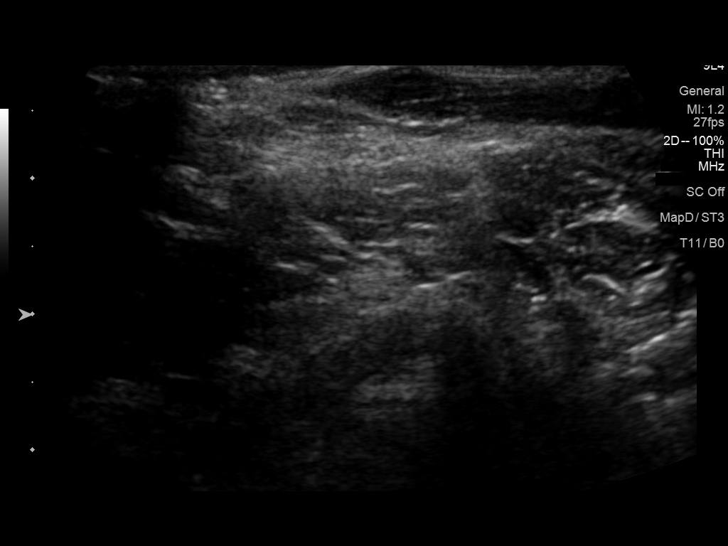
[im 32/32]
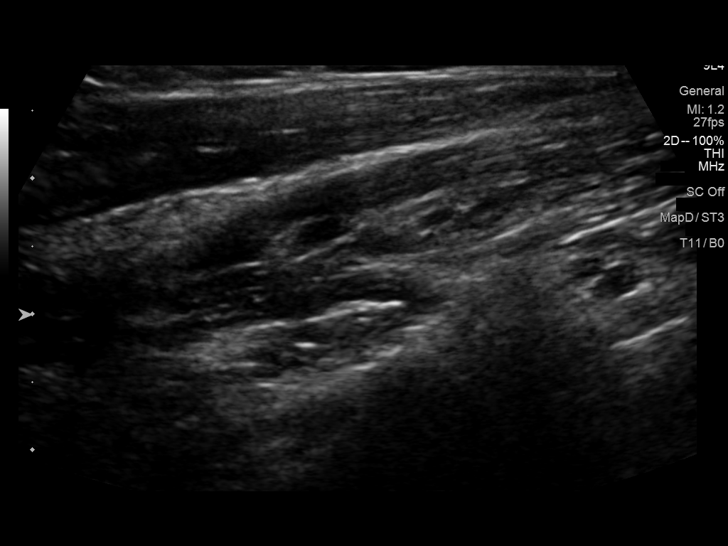

[14 of 25 positions shown; findings below may reference images not displayed]

FINDINGS: Parenchymal Echotexture: Mildly heterogenous

Isthmus: 0.3 cm

Right lobe: 4.9 x 1.3 x 1.8 cm

Left lobe: 5.7 x 1.3 x 1.8 cm

_________________________________________________________

Estimated total number of nodules >/= 1 cm: 0

Number of spongiform nodules >/=  2 cm not described below (TR1): 0

Number of mixed cystic and solid nodules >/= 1.5 cm not described
below (TR2): 0

_________________________________________________________

No discrete nodules are seen within the thyroid gland.
IMPRESSION: Previously seen left thyroid nodules are not identified on the
current examination.

The above is in keeping with the ACR TI-RADS recommendations - [HOSPITAL] 7086;[DATE].

## 2023-09-24 ENCOUNTER — Encounter: Payer: Self-pay | Admitting: Student in an Organized Health Care Education/Training Program

## 2023-09-24 ENCOUNTER — Ambulatory Visit (INDEPENDENT_AMBULATORY_CARE_PROVIDER_SITE_OTHER): Payer: Self-pay | Admitting: Student in an Organized Health Care Education/Training Program

## 2023-09-24 VITALS — BP 101/65 | HR 71 | Ht 68.4 in | Wt 167.0 lb

## 2023-09-24 DIAGNOSIS — E785 Hyperlipidemia, unspecified: Secondary | ICD-10-CM | POA: Diagnosis not present

## 2023-09-24 DIAGNOSIS — E049 Nontoxic goiter, unspecified: Secondary | ICD-10-CM | POA: Diagnosis not present

## 2023-09-24 DIAGNOSIS — Z3041 Encounter for surveillance of contraceptive pills: Secondary | ICD-10-CM

## 2023-09-24 DIAGNOSIS — Z0000 Encounter for general adult medical examination without abnormal findings: Secondary | ICD-10-CM | POA: Insufficient documentation

## 2023-09-24 DIAGNOSIS — Z1231 Encounter for screening mammogram for malignant neoplasm of breast: Secondary | ICD-10-CM

## 2023-09-24 LAB — LIPID PANEL
Cholesterol: 210 mg/dL — ABNORMAL HIGH (ref 0–200)
HDL: 53.8 mg/dL (ref >39.00)
LDL Cholesterol: 119 mg/dL — ABNORMAL HIGH (ref 0–99)
NonHDL: 156.08
Total CHOL/HDL Ratio: 4
Triglycerides: 187 mg/dL — ABNORMAL HIGH (ref 0.0–149.0)
VLDL: 37.4 mg/dL (ref 0.0–40.0)

## 2023-09-24 LAB — TSH: TSH: 0.79 u[IU]/mL (ref 0.35–5.50)

## 2023-09-24 MED ORDER — LO LOESTRIN FE 1 MG-10 MCG / 10 MCG PO TABS: 1.0000 | ORAL_TABLET | Freq: Every day | ORAL | 11 refills | Status: AC

## 2023-09-24 NOTE — Patient Instructions (Signed)
 VISIT SUMMARY:  Today, you had a general wellness exam to review your overall health and address any concerns. We discussed your weight management, cholesterol levels, thyroid  health, attention concerns, contraceptive use, and general health maintenance.  YOUR PLAN:  -WEIGHT MANAGEMENT: Your current BMI is 25, which is considered healthy, but you expressed a desire to lose weight. We discussed that a daily calorie deficit of 400-500 calories can help you lose 1-2 pounds per week. Continue using your food tracking apps to monitor your intake and maintain your physical activities like soccer and Pilates.  -CHOLESTEROL MANAGEMENT: Your cholesterol levels were slightly elevated previously but have improved with dietary changes. Your cardiovascular risk is low. We will check your cholesterol levels this year and reassess every three years if they remain stable.  -THYROID  NODULES: You have a mild goiter that has been stable over the years with no significant changes in ultrasounds. Your thyroid  function tests have been normal. We will monitor your thyroid  function every 2-3 years unless you develop new symptoms.  -ATTENTION CONCERNS: You have some difficulties with attention, particularly in starting and completing tasks, but you do not meet the criteria for ADHD. These symptoms are manageable and not significantly impacting your life. We will continue to monitor your symptoms and reassess if they worsen.  -CONTRACEPTIVE MANAGEMENT: You are currently using Loestrin  for birth control without any issues. We discussed the option of using it continuously to skip periods. Your prescription for Loestrin  has been refilled.  -GENERAL HEALTH MAINTENANCE: You are generally healthy with no significant medical issues. We discussed routine health maintenance, including the importance of screenings and vaccinations. Due to your family history of breast cancer, we recommend getting a mammogram this year. We also  discussed the benefits of getting a flu vaccination in the fall.  INSTRUCTIONS:  Please schedule a mammogram this year due to your family history of breast cancer. Continue monitoring your weight and cholesterol levels as discussed. If you experience any new symptoms or have concerns, please schedule a follow-up appointment.

## 2023-09-24 NOTE — Progress Notes (Signed)
 New Patient Office Visit  Subjective    Patient ID: Debra [name], female    DOB: November 03, 1985  Age: 38 y.o. MRN: [id number]  CC:  Chief Complaint  Patient presents with   Establish Care    HPI   Debra [name] presents to establish care  Discussed the use of AI scribe software for clinical note transcription with the patient, who gave verbal consent to proceed.  History of Present Illness Debra [name] is a 38 year old female who presents for a general wellness exam.  Hyperlipidemia - Elevated cholesterol levels identified on recent blood work - No current symptoms attributable to hyperlipidemia  Weight fluctuations and lifestyle - Experienced weight loss last year, followed by weight regain - Weight gain attributed to Depo-Provera  use and decreased physical activity due to not working - [employer] and [employer] apps for food intake tracking - Goal weight is 140-150 pounds - Remains physically active with soccer and Pilates  Low back pain - Chronic low back pain attributed to core weakness - Monthly chiropractic visits for maintenance provide symptom relief - No significant limitations in physical activities  Contraceptive use and associated symptoms - Currently taking Loestrin  for birth control - No adverse effects from Loestrin  - No recent fatigue, restless legs, or unusual cravings  Cognitive symptoms and attention - Experienced increased focus while on steroids for a sinus infection - Underwent evaluation at [facility]; some signs of ADD but no diagnosis - Difficulty with task initiation and organization, particularly at work - Manages symptoms with organizational systems such as lists  Thyroid  enlargement - History of thyroid  enlargement - Monitored with ultrasounds for three consecutive years without significant changes  Knee instability status post acl reconstruction - History of ACL reconstruction in 2016 with hamstring  graft - No subjective knee instability - No significant functional limitations   Outpatient Encounter Medications as of 09/24/2023  Medication Sig   [DISCONTINUED] Multiple Vitamin (MULTIVITAMIN ADULT PO) Take by mouth daily.   [DISCONTINUED] norelgestromin-ethinyl estradiol (XULANE) 150-35 MCG/24HR transdermal patch Place 1 patch onto the skin once a week.   Norethindrone-Ethinyl Estradiol-Fe Biphas (LO LOESTRIN FE ) 1 MG-10 MCG / 10 MCG tablet Take 1 tablet by mouth daily.   [DISCONTINUED] lisdexamfetamine (VYVANSE ) 30 MG capsule Take 1 capsule (30 mg total) by mouth daily. (Patient not taking: Reported on 09/24/2023)   [DISCONTINUED] Norethindrone-Ethinyl Estradiol-Fe Biphas (LO LOESTRIN FE ) 1 MG-10 MCG / 10 MCG tablet Take 1 tablet by mouth daily.   No facility-administered encounter medications on file as of 09/24/2023.    Past Medical History:  Diagnosis Date   Allergy 2006   seasonal/environmental   Breech birth C sec 11/13/2016   Hydronephrosis    R C/S 1/11 03/30/2018    Past Surgical History:  Procedure Laterality Date   ABDOMINAL SURGERY     age 52, exploratory? Not sure if appendix is out-   ANTERIOR CRUCIATE LIGAMENT REPAIR Left 02/2015   CESAREAN SECTION N/A 11/13/2016   Procedure: CESAREAN SECTION;  Surgeon: Latisha Medford, MD;  Location: [facility] BIRTHING SUITES;  Service: Obstetrics;  Laterality: N/A;  Primary edc 11/17/16 NKDA need RNFA   CESAREAN SECTION N/A 03/30/2018   Procedure: Repeat CESAREAN SECTION;  Surgeon: Kandyce Sor, MD;  Location: [facility] BIRTHING SUITES;  Service: Obstetrics;  Laterality: N/A;  EDC: 04/05/2018    Family History  Problem Relation Age of Onset   Breast cancer Mother    Cancer Mother    Alcohol abuse Father  Breast cancer Maternal Grandmother    Alzheimer's disease Paternal Grandmother    Alcohol abuse Paternal Grandfather    Cancer Paternal Grandfather    Pancreatic cancer Maternal Uncle    Arthritis/Rheumatoid Paternal Aunt      Social History   Socioeconomic History   Marital status: Married    Spouse name: Not on file   Number of children: 2   Years of education: 18   Highest education level: Master's degree (e.g., MA, MS, MEng, MEd, MSW, MBA)  Occupational History   Not on file  Tobacco Use   Smoking status: Never   Smokeless tobacco: Never  Vaping Use   Vaping status: Never Used  Substance and Sexual Activity   Alcohol use: Yes    Alcohol/week: 1.0 - 2.0 standard drink of alcohol    Types: 1 - 2 Standard drinks or equivalent per week   Drug use: No   Sexual activity: Yes    Partners: Male    Birth control/protection: Pill  Other Topics Concern   Not on file  Social History Narrative   Not on file   Social Drivers of Health   Financial Resource Strain: Low Risk  (09/21/2023)   Overall Financial Resource Strain (CARDIA)    Difficulty of Paying Living Expenses: Not hard at all  Food Insecurity: No Food Insecurity (09/21/2023)   Hunger Vital Sign    Worried About Running Out of Food in the Last Year: Never true    Ran Out of Food in the Last Year: Never true  Transportation Needs: No Transportation Needs (09/21/2023)   PRAPARE - Administrator, Civil Service (Medical): No    Lack of Transportation (Non-Medical): No  Physical Activity: Insufficiently Active (09/21/2023)   Exercise Vital Sign    Days of Exercise per Week: 4 days    Minutes of Exercise per Session: 30 min  Stress: No Stress Concern Present (09/21/2023)   [employer] of Occupational Health - Occupational Stress Questionnaire    Feeling of Stress: Only a little  Social Connections: Moderately Isolated (09/21/2023)   Social Connection and Isolation Panel    Frequency of Communication with Friends and Family: Once a week    Frequency of Social Gatherings with Friends and Family: Once a week    Attends Religious Services: Never    Database administrator or Organizations: Yes    Attends Engineer, structural:  More than 4 times per year    Marital Status: Married  Catering manager Violence: Not At Risk (05/29/2017)   Humiliation, Afraid, Rape, and Kick questionnaire    Fear of Current or Ex-Partner: No    Emotionally Abused: No    Physically Abused: No    Sexually Abused: No        Objective    BP 101/65   Pulse 71   Ht 5' 8.4 (1.737 m)   Wt 167 lb (75.8 kg)   SpO2 99%   BMI 25.10 kg/m   Physical Exam  Physical Exam MEASUREMENTS: BMI- 25.0. Gen: Well-appearing HEENT: Eyes normal. Tympanic membranes normal. Throat normal. NECK: Mild goiter. CHEST: Lungs clear to auscultation. CARDIOVASCULAR: Heart sounds normal, regular rhythm. SKIN: No moles on back. Neuro: Alert, conversational, full strength upper and lower extremities, normal gait Psych: Appropriate mood and affect, not anxious or depressed appearing      Assessment & Plan:   Assessment and Plan   Problem List Items Addressed This Visit       Medium  Hyperlipidemia, mild (Chronic)   Previous cholesterol levels were slightly elevated but improved with dietary changes. Current cardiovascular risk is low, The 10-year ASCVD risk score (Arnett DK, et al., 2019) is: 0.3%. Prefers lifestyle modifications over medication. - Check cholesterol levels this year. - Reassess cholesterol every three years if levels remain stable.      Relevant Orders   Lipid panel     Low   Thyroid  goiter (Chronic)   Mild goiter with previous ultrasounds showing no significant changes. No symptoms of thyroid  dysfunction. Previous thyroid  function tests were normal. - Monitor thyroid  function every 2-3 years unless new symptoms arise. - Can repeat thyroid  ultrasound if there is any growth, physical change, or every 5 years if she remains asymptomatic.      Relevant Orders   TSH   Encounter for contraceptive management, unspecified - Primary (Chronic)   Currently on Loestrin  for birth control with no issues. Discussed option of  continuous use to skip periods. - Refill Loestrin  prescription. - Discuss option of continuous use to skip periods.      Relevant Medications   Norethindrone-Ethinyl Estradiol-Fe Biphas (LO LOESTRIN FE ) 1 MG-10 MCG / 10 MCG tablet   Healthcare maintenance (Chronic)   Routine wellness examination for a 38 year old female with no acute complaints or significant health issues. Discussed weight management and dietary habits. Generally healthy with no significant medical issues. Discussed routine health maintenance including screenings and vaccinations. Recommended mammogram this year due to family history of breast cancer, as her mother had breast cancer at age 66, placing her at a slightly higher risk. - Order mammogram this year due to family history of breast cancer. - Discuss breast cancer screening guidelines and benefits. - Discuss flu vaccination in the fall. - Discuss weight management strategies, including a calorie deficit for weight loss. - Encourage continued physical activity for at least 150 minutes every week.      Other Visit Diagnoses       Encounter for screening mammogram for malignant neoplasm of breast       Relevant Orders   MM Digital Screening       Return in about 1 year (around 09/23/2024) for routine exam.   Cleatus Debby Specking, MD

## 2023-09-24 NOTE — Assessment & Plan Note (Signed)
 Previous cholesterol levels were slightly elevated but improved with dietary changes. Current cardiovascular risk is low, The 10-year ASCVD risk score (Arnett DK, et al., 2019) is: 0.3%. Prefers lifestyle modifications over medication. - Check cholesterol levels this year. - Reassess cholesterol every three years if levels remain stable.

## 2023-09-24 NOTE — Assessment & Plan Note (Addendum)
 Routine wellness examination for a 38 year old female with no acute complaints or significant health issues. Discussed weight management and dietary habits. Generally healthy with no significant medical issues. Discussed routine health maintenance including screenings and vaccinations. Recommended mammogram this year due to family history of breast cancer, as her mother had breast cancer at age 26, placing her at a slightly higher risk. - Order mammogram this year due to family history of breast cancer. - Discuss breast cancer screening guidelines and benefits. - Discuss flu vaccination in the fall. - Discuss weight management strategies, including a calorie deficit for weight loss. - Encourage continued physical activity for at least 150 minutes every week.

## 2023-09-24 NOTE — Assessment & Plan Note (Signed)
 Currently on Loestrin  for birth control with no issues. Discussed option of continuous use to skip periods. - Refill Loestrin  prescription. - Discuss option of continuous use to skip periods.

## 2023-09-24 NOTE — Assessment & Plan Note (Signed)
 Mild goiter with previous ultrasounds showing no significant changes. No symptoms of thyroid  dysfunction. Previous thyroid  function tests were normal. - Monitor thyroid  function every 2-3 years unless new symptoms arise. - Can repeat thyroid  ultrasound if there is any growth, physical change, or every 5 years if she remains asymptomatic.

## 2023-09-25 ENCOUNTER — Ambulatory Visit: Payer: Self-pay | Admitting: Student in an Organized Health Care Education/Training Program

## 2023-10-02 ENCOUNTER — Ambulatory Visit
Admission: RE | Admit: 2023-10-02 | Discharge: 2023-10-02 | Disposition: A | Discharge status: Home / Self Care | Source: Ambulatory Visit | Attending: Student in an Organized Health Care Education/Training Program

## 2023-10-02 DIAGNOSIS — Z1231 Encounter for screening mammogram for malignant neoplasm of breast: Secondary | ICD-10-CM

## 2023-10-08 ENCOUNTER — Other Ambulatory Visit: Payer: Self-pay | Admitting: Student in an Organized Health Care Education/Training Program

## 2023-10-08 ENCOUNTER — Telehealth: Payer: Self-pay

## 2023-10-08 DIAGNOSIS — R928 Other abnormal and inconclusive findings on diagnostic imaging of breast: Secondary | ICD-10-CM

## 2023-10-08 NOTE — Telephone Encounter (Signed)
 Faxed STAT order for diagnostic exam from mammogram to the breast center of [facility]. Placed in provider folder at front desk

## 2023-10-11 ENCOUNTER — Ambulatory Visit
Admission: RE | Admit: 2023-10-11 | Discharge: 2023-10-11 | Disposition: A | Discharge status: Home / Self Care | Source: Ambulatory Visit | Attending: Student in an Organized Health Care Education/Training Program

## 2023-10-11 DIAGNOSIS — R928 Other abnormal and inconclusive findings on diagnostic imaging of breast: Secondary | ICD-10-CM

## 2023-10-15 ENCOUNTER — Encounter

## 2023-10-24 ENCOUNTER — Encounter: Payer: Self-pay | Admitting: Nurse Practitioner

## 2024-02-29 ENCOUNTER — Encounter: Payer: Self-pay | Admitting: Student in an Organized Health Care Education/Training Program

## 2024-02-29 ENCOUNTER — Other Ambulatory Visit: Payer: Self-pay | Admitting: Student in an Organized Health Care Education/Training Program

## 2024-02-29 DIAGNOSIS — R921 Mammographic calcification found on diagnostic imaging of breast: Secondary | ICD-10-CM

## 2024-03-04 ENCOUNTER — Telehealth: Payer: Self-pay

## 2024-03-04 NOTE — Telephone Encounter (Signed)
 Fax for stat imagining order for [facility] [place] has been placed on providers desk for signature.   Patient's appointment with them is on 04/16/2024.

## 2024-03-04 NOTE — Telephone Encounter (Signed)
 Forms have ben faxed back

## 2024-04-16 ENCOUNTER — Ambulatory Visit: Admitting: Student in an Organized Health Care Education/Training Program

## 2024-04-16 ENCOUNTER — Encounter: Payer: Self-pay | Admitting: Student in an Organized Health Care Education/Training Program

## 2024-04-16 ENCOUNTER — Other Ambulatory Visit

## 2024-04-16 ENCOUNTER — Encounter

## 2024-04-16 VITALS — BP 117/83 | HR 97 | Temp 98.4°F | Ht 68.0 in | Wt 166.6 lb

## 2024-04-16 DIAGNOSIS — R112 Nausea with vomiting, unspecified: Secondary | ICD-10-CM | POA: Diagnosis not present

## 2024-04-16 DIAGNOSIS — R197 Diarrhea, unspecified: Secondary | ICD-10-CM

## 2024-04-16 MED ORDER — ONDANSETRON 4 MG PO TBDP: 4.0000 mg | ORAL_TABLET | Freq: Three times a day (TID) | ORAL | 0 refills | Status: AC | PRN

## 2024-04-16 NOTE — Progress Notes (Signed)
 "  Acute Office Visit  Patient ID: Debra [name], female    DOB: 06-10-1985, 39 y.o.   MRN: [id number]  PCP: Jerrell Cleatus Ned, MD  Chief Complaint  Patient presents with   Medical Management of Chronic Issues    Has been in bed since Monday, not feeling well. Frequent diarrhea. Took Flu and Covid test Sunday night and it was Negative.     Subjective:     HPI  Discussed the use of AI scribe software for clinical note transcription with the patient, who gave verbal consent to proceed.  History of Present Illness Debra [name] is a 39 year old female who presents with gastrointestinal symptoms and weakness.  She has been experiencing significant gastrointestinal symptoms and weakness since Sunday evening. She tested negative for both flu and COVID. By Monday, she was unable to work a full day and has been mostly bedridden since Monday afternoon.  She denies fever but reports significant weakness and an inability to eat despite feeling hungry. She has been consuming water and Gatorade but vomited the Gatorade within two hours of consumption, with episodes of vomiting occurring once on Monday and once on Tuesday. She has diarrhea, with approximately six bowel movements on Tuesday despite minimal food intake. Her last food intake was a saltine cracker at 9 AM today. Abdominal pain is described as 'gargling and discomfort'.  She also has a cough with increased mucus, which she attributes to sinus issues from the previous week. No recent antibiotic use or changes in medications. She notes a slight ache in her mid-back on the left side that comes and goes. Lightheadedness upon standing has improved today.  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 She has not been around any known sick children at work.      Objective:    BP  117/83   Pulse 97   Temp 98.4 F (36.9 C) (Oral)   Ht 5' 8 (1.727 m)   Wt 166 lb 9.6 oz (75.6 kg)   SpO2 98%   BMI 25.33 kg/m   Physical Exam  Gen: Tired appearing Ears: Normal tympanic membranes Neck: No tender cervical adenopathy Heart: Mildly tachycardic, no murmur Abd: Soft, nontender, nondistended, no organomegaly, no rebound or guarding Ext: Warm, no edema      Assessment & Plan:   Problem List Items Addressed This Visit       Unprioritized   Nausea vomiting and diarrhea - Primary   Two days of acute nausea, vomiting, and diarrhea seems most consistent with a viral gastroenteritis.  Vital signs are reassuring today.  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  Exam is reassuring, there is no abdominal tenderness.  I think something like appendicitis or cholecystitis is very unlikely given her benign exam.  We talked about supportive care for this illness.  Will prescribe Zofran  to help with the nausea.  EKG a few years ago had a normal QTc.  She takes no other medications.  Talked about using Imodium for diarrhea.  No recent antibiotic exposures.  Recommended NSAIDs and Tylenol  for fever and muscle aches.  We talked about staying hydrated with water, Gatorade, or Pedialyte.  Increase oral intake as able at home.      Relevant Medications   ondansetron  (ZOFRAN -ODT) 4 MG disintegrating tablet    Meds ordered this encounter  Medications   ondansetron  (ZOFRAN -ODT) 4 MG disintegrating tablet    Sig: Take 1 tablet (4 mg total) by mouth every 8 (eight) hours as needed for nausea or vomiting.    Dispense:  20 tablet    Refill:  0    Return if symptoms worsen or fail to improve.  Cleatus Debby Specking, MD [facility]   "

## 2024-04-16 NOTE — Patient Instructions (Signed)
" °  VISIT SUMMARY: During your visit, we discussed your gastrointestinal symptoms and weakness that began on Sunday evening. You tested negative for flu and COVID, and we reviewed your symptoms, including vomiting, diarrhea, and abdominal discomfort. We also discussed your cough and mucus production, likely related to sinus issues from the previous week.  YOUR PLAN: -ACUTE VIRAL GASTROENTERITIS: Acute viral gastroenteritis is an infection of the intestines caused by a virus, leading to symptoms like vomiting, diarrhea, and abdominal discomfort. It is often spread through contact with infected individuals or contaminated surfaces. You likely contracted this from recent exposure to children. We have prescribed Zofran  to help with nausea. It is important to stay hydrated by drinking water, Gatorade, and Pedialyte. You can take Imodium if diarrhea becomes severe and Tylenol  or ibuprofen  for any aches or inflammation. We have provided a work note for additional days off, and you can return to work on Friday if you are feeling well.  INSTRUCTIONS: Please follow the prescribed treatment plan, including taking Zofran  for nausea, staying hydrated, and using Imodium and Tylenol  or ibuprofen  as needed. If your symptoms do not improve or worsen, please contact our office. You are advised to take additional days off work and can return on Friday if you are feeling well.    Contains text generated by [employer].   "

## 2024-04-16 NOTE — Assessment & Plan Note (Signed)
 Two days of acute nausea, vomiting, and diarrhea seems most consistent with a viral gastroenteritis.  Vital signs are reassuring today.  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  Exam is reassuring, there is no abdominal tenderness.  I think something like appendicitis or cholecystitis is very unlikely given her benign exam.  We talked about supportive care for this illness.  Will prescribe Zofran  to help with the nausea.  EKG a few years ago had a normal QTc.  She takes no other medications.  Talked about using Imodium for diarrhea.  No recent antibiotic exposures.  Recommended NSAIDs and Tylenol  for fever and muscle aches.  We talked about staying hydrated with water, Gatorade, or Pedialyte.  Increase oral intake as able at home.

## 2024-05-06 ENCOUNTER — Other Ambulatory Visit

## 2024-05-06 ENCOUNTER — Encounter

## 2024-09-24 ENCOUNTER — Encounter: Admitting: Student in an Organized Health Care Education/Training Program

## 2024-10-08 ENCOUNTER — Encounter: Admitting: Student in an Organized Health Care Education/Training Program
